# Patient Record
Sex: Female | Born: 1980 | Hispanic: Yes | Marital: Single | Smoking: Never smoker
Health system: Southern US, Community
[De-identification: ages and names within clinical notes are randomized; demographics above are authoritative.]

## PROBLEM LIST (undated history)

## (undated) ENCOUNTER — Inpatient Hospital Stay (HOSPITAL_COMMUNITY): Payer: Self-pay

## (undated) DIAGNOSIS — IMO0002 Reserved for concepts with insufficient information to code with codable children: Secondary | ICD-10-CM

## (undated) DIAGNOSIS — E119 Type 2 diabetes mellitus without complications: Secondary | ICD-10-CM

## (undated) DIAGNOSIS — N751 Abscess of Bartholin's gland: Secondary | ICD-10-CM

## (undated) DIAGNOSIS — Z98891 History of uterine scar from previous surgery: Secondary | ICD-10-CM

## (undated) DIAGNOSIS — E669 Obesity, unspecified: Secondary | ICD-10-CM

## (undated) HISTORY — DX: Obesity, unspecified: E66.9

## (undated) HISTORY — DX: Reserved for concepts with insufficient information to code with codable children: IMO0002

## (undated) HISTORY — DX: History of uterine scar from previous surgery: Z98.891

## (undated) HISTORY — DX: Abscess of Bartholin's gland: N75.1

---

## 2002-07-22 ENCOUNTER — Inpatient Hospital Stay (HOSPITAL_COMMUNITY): Admission: AD | Admit: 2002-07-22 | Discharge: 2002-07-22 | Payer: Self-pay | Admitting: *Deleted

## 2002-07-23 ENCOUNTER — Inpatient Hospital Stay (HOSPITAL_COMMUNITY): Admission: AD | Admit: 2002-07-23 | Discharge: 2002-07-23 | Payer: Self-pay | Admitting: Obstetrics and Gynecology

## 2002-07-25 ENCOUNTER — Inpatient Hospital Stay (HOSPITAL_COMMUNITY): Admission: AD | Admit: 2002-07-25 | Discharge: 2002-07-25 | Payer: Self-pay | Admitting: *Deleted

## 2002-07-26 ENCOUNTER — Inpatient Hospital Stay (HOSPITAL_COMMUNITY): Admission: AD | Admit: 2002-07-26 | Discharge: 2002-07-28 | Payer: Self-pay | Admitting: *Deleted

## 2004-07-18 ENCOUNTER — Emergency Department (HOSPITAL_COMMUNITY): Admission: EM | Admit: 2004-07-18 | Discharge: 2004-07-18 | Payer: Self-pay | Admitting: Emergency Medicine

## 2004-07-21 ENCOUNTER — Ambulatory Visit (HOSPITAL_COMMUNITY): Admission: RE | Admit: 2004-07-21 | Discharge: 2004-07-21 | Payer: Self-pay | Admitting: Emergency Medicine

## 2004-07-31 ENCOUNTER — Emergency Department (HOSPITAL_COMMUNITY): Admission: EM | Admit: 2004-07-31 | Discharge: 2004-07-31 | Payer: Self-pay

## 2005-09-28 ENCOUNTER — Ambulatory Visit (HOSPITAL_COMMUNITY): Admission: RE | Admit: 2005-09-28 | Discharge: 2005-09-28 | Payer: Self-pay | Admitting: *Deleted

## 2005-10-12 ENCOUNTER — Ambulatory Visit: Payer: Self-pay | Admitting: Family Medicine

## 2005-10-13 ENCOUNTER — Ambulatory Visit (HOSPITAL_COMMUNITY): Admission: RE | Admit: 2005-10-13 | Discharge: 2005-10-13 | Payer: Self-pay | Admitting: Obstetrics and Gynecology

## 2005-11-23 ENCOUNTER — Ambulatory Visit: Payer: Self-pay | Admitting: Obstetrics and Gynecology

## 2005-11-29 ENCOUNTER — Ambulatory Visit (HOSPITAL_COMMUNITY): Admission: RE | Admit: 2005-11-29 | Discharge: 2005-11-29 | Payer: Self-pay | Admitting: *Deleted

## 2006-02-26 ENCOUNTER — Inpatient Hospital Stay (HOSPITAL_COMMUNITY): Admission: AD | Admit: 2006-02-26 | Discharge: 2006-02-27 | Payer: Self-pay | Admitting: Family Medicine

## 2006-03-09 ENCOUNTER — Ambulatory Visit: Payer: Self-pay | Admitting: Gynecology

## 2006-03-18 ENCOUNTER — Inpatient Hospital Stay (HOSPITAL_COMMUNITY): Admission: AD | Admit: 2006-03-18 | Discharge: 2006-03-19 | Payer: Self-pay | Admitting: Family Medicine

## 2006-03-21 ENCOUNTER — Ambulatory Visit: Payer: Self-pay | Admitting: Obstetrics and Gynecology

## 2006-04-04 ENCOUNTER — Ambulatory Visit: Payer: Self-pay | Admitting: Family Medicine

## 2006-04-06 ENCOUNTER — Encounter (INDEPENDENT_AMBULATORY_CARE_PROVIDER_SITE_OTHER): Payer: Self-pay | Admitting: Specialist

## 2006-04-06 ENCOUNTER — Ambulatory Visit (HOSPITAL_COMMUNITY): Admission: RE | Admit: 2006-04-06 | Discharge: 2006-04-06 | Payer: Self-pay | Admitting: Family Medicine

## 2006-04-06 ENCOUNTER — Ambulatory Visit: Payer: Self-pay | Admitting: Family Medicine

## 2006-04-19 ENCOUNTER — Ambulatory Visit: Payer: Self-pay | Admitting: Family Medicine

## 2006-06-14 ENCOUNTER — Ambulatory Visit: Payer: Self-pay | Admitting: Obstetrics and Gynecology

## 2006-10-17 ENCOUNTER — Inpatient Hospital Stay (HOSPITAL_COMMUNITY): Admission: AD | Admit: 2006-10-17 | Discharge: 2006-10-17 | Payer: Self-pay | Admitting: Family Medicine

## 2006-10-31 ENCOUNTER — Ambulatory Visit: Payer: Self-pay | Admitting: Obstetrics and Gynecology

## 2007-03-01 ENCOUNTER — Ambulatory Visit: Payer: Self-pay | Admitting: Obstetrics and Gynecology

## 2007-03-06 ENCOUNTER — Ambulatory Visit: Payer: Self-pay | Admitting: Gynecology

## 2007-03-13 ENCOUNTER — Ambulatory Visit: Payer: Self-pay | Admitting: Obstetrics and Gynecology

## 2007-03-15 ENCOUNTER — Ambulatory Visit (HOSPITAL_COMMUNITY): Admission: RE | Admit: 2007-03-15 | Discharge: 2007-03-15 | Payer: Self-pay | Admitting: Family Medicine

## 2007-03-21 ENCOUNTER — Ambulatory Visit: Payer: Self-pay | Admitting: Gynecology

## 2007-05-03 ENCOUNTER — Encounter: Payer: Self-pay | Admitting: Obstetrics & Gynecology

## 2007-05-03 ENCOUNTER — Ambulatory Visit: Payer: Self-pay | Admitting: Obstetrics & Gynecology

## 2007-05-20 ENCOUNTER — Ambulatory Visit (HOSPITAL_COMMUNITY): Admission: RE | Admit: 2007-05-20 | Discharge: 2007-05-20 | Payer: Self-pay | Admitting: Obstetrics and Gynecology

## 2008-01-09 ENCOUNTER — Ambulatory Visit: Payer: Self-pay | Admitting: Obstetrics and Gynecology

## 2008-10-29 ENCOUNTER — Ambulatory Visit: Payer: Self-pay | Admitting: Obstetrics and Gynecology

## 2008-10-29 ENCOUNTER — Encounter (INDEPENDENT_AMBULATORY_CARE_PROVIDER_SITE_OTHER): Payer: Self-pay | Admitting: Obstetrics & Gynecology

## 2008-10-29 LAB — CONVERTED CEMR LAB: Preg, Serum: NEGATIVE

## 2008-10-30 ENCOUNTER — Ambulatory Visit (HOSPITAL_COMMUNITY): Admission: RE | Admit: 2008-10-30 | Discharge: 2008-10-30 | Payer: Self-pay | Admitting: Family Medicine

## 2009-08-30 ENCOUNTER — Inpatient Hospital Stay (HOSPITAL_COMMUNITY): Admission: AD | Admit: 2009-08-30 | Discharge: 2009-08-30 | Payer: Self-pay | Admitting: Obstetrics

## 2009-08-30 ENCOUNTER — Encounter: Admission: RE | Admit: 2009-08-30 | Discharge: 2009-08-30 | Payer: Self-pay | Admitting: Obstetrics

## 2009-09-07 ENCOUNTER — Inpatient Hospital Stay (HOSPITAL_COMMUNITY): Admission: RE | Admit: 2009-09-07 | Discharge: 2009-09-09 | Payer: Self-pay | Admitting: Obstetrics

## 2010-03-15 ENCOUNTER — Inpatient Hospital Stay (HOSPITAL_COMMUNITY): Admission: AD | Admit: 2010-03-15 | Discharge: 2010-03-15 | Payer: Self-pay | Admitting: Obstetrics & Gynecology

## 2011-01-07 ENCOUNTER — Encounter: Payer: Self-pay | Admitting: Family Medicine

## 2011-01-08 ENCOUNTER — Encounter: Payer: Self-pay | Admitting: *Deleted

## 2011-03-12 LAB — CBC
HCT: 39.6 % (ref 36.0–46.0)
Hemoglobin: 13.5 g/dL (ref 12.0–15.0)
MCHC: 34.1 g/dL (ref 30.0–36.0)
MCV: 93 fL (ref 78.0–100.0)
Platelets: 190 10*3/uL (ref 150–400)
RBC: 4.26 MIL/uL (ref 3.87–5.11)
RDW: 12.4 % (ref 11.5–15.5)
WBC: 7.1 10*3/uL (ref 4.0–10.5)

## 2011-03-12 LAB — GC/CHLAMYDIA PROBE AMP, GENITAL
Chlamydia, DNA Probe: NEGATIVE
GC Probe Amp, Genital: NEGATIVE

## 2011-03-12 LAB — WET PREP, GENITAL
Trich, Wet Prep: NONE SEEN
Yeast Wet Prep HPF POC: NONE SEEN

## 2011-03-12 LAB — POCT PREGNANCY, URINE: Preg Test, Ur: NEGATIVE

## 2011-03-24 LAB — CBC
HCT: 26.8 % — ABNORMAL LOW (ref 36.0–46.0)
HCT: 33.8 % — ABNORMAL LOW (ref 36.0–46.0)
Hemoglobin: 11.6 g/dL — ABNORMAL LOW (ref 12.0–15.0)
Hemoglobin: 9.4 g/dL — ABNORMAL LOW (ref 12.0–15.0)
MCHC: 34.2 g/dL (ref 30.0–36.0)
MCHC: 34.9 g/dL (ref 30.0–36.0)
MCV: 92 fL (ref 78.0–100.0)
MCV: 92.1 fL (ref 78.0–100.0)
Platelets: 116 10*3/uL — ABNORMAL LOW (ref 150–400)
Platelets: 142 10*3/uL — ABNORMAL LOW (ref 150–400)
RBC: 2.91 MIL/uL — ABNORMAL LOW (ref 3.87–5.11)
RBC: 3.67 MIL/uL — ABNORMAL LOW (ref 3.87–5.11)
RDW: 13.5 % (ref 11.5–15.5)
RDW: 13.8 % (ref 11.5–15.5)
WBC: 8.3 10*3/uL (ref 4.0–10.5)
WBC: 9.4 10*3/uL (ref 4.0–10.5)

## 2011-03-24 LAB — RPR: RPR Ser Ql: NONREACTIVE

## 2011-04-11 ENCOUNTER — Emergency Department (HOSPITAL_COMMUNITY): Payer: Self-pay

## 2011-04-11 ENCOUNTER — Emergency Department (HOSPITAL_COMMUNITY)
Admission: EM | Admit: 2011-04-11 | Discharge: 2011-04-12 | Disposition: A | Discharge status: Home / Self Care | Payer: Self-pay | Attending: Emergency Medicine | Admitting: Emergency Medicine

## 2011-04-11 DIAGNOSIS — F411 Generalized anxiety disorder: Secondary | ICD-10-CM | POA: Insufficient documentation

## 2011-04-11 DIAGNOSIS — M25469 Effusion, unspecified knee: Secondary | ICD-10-CM | POA: Insufficient documentation

## 2011-04-11 DIAGNOSIS — R51 Headache: Secondary | ICD-10-CM | POA: Insufficient documentation

## 2011-04-11 DIAGNOSIS — F3289 Other specified depressive episodes: Secondary | ICD-10-CM | POA: Insufficient documentation

## 2011-04-11 DIAGNOSIS — R071 Chest pain on breathing: Secondary | ICD-10-CM | POA: Insufficient documentation

## 2011-04-11 DIAGNOSIS — T148XXA Other injury of unspecified body region, initial encounter: Secondary | ICD-10-CM | POA: Insufficient documentation

## 2011-04-11 DIAGNOSIS — M542 Cervicalgia: Secondary | ICD-10-CM | POA: Insufficient documentation

## 2011-04-11 DIAGNOSIS — M25569 Pain in unspecified knee: Secondary | ICD-10-CM | POA: Insufficient documentation

## 2011-04-11 DIAGNOSIS — S8990XA Unspecified injury of unspecified lower leg, initial encounter: Secondary | ICD-10-CM | POA: Insufficient documentation

## 2011-04-11 DIAGNOSIS — Y929 Unspecified place or not applicable: Secondary | ICD-10-CM | POA: Insufficient documentation

## 2011-04-11 DIAGNOSIS — F329 Major depressive disorder, single episode, unspecified: Secondary | ICD-10-CM | POA: Insufficient documentation

## 2011-04-11 LAB — URINALYSIS, ROUTINE W REFLEX MICROSCOPIC
Leukocytes, UA: NEGATIVE
Nitrite: NEGATIVE
Specific Gravity, Urine: 1.026 (ref 1.005–1.030)
Urobilinogen, UA: 1 mg/dL (ref 0.0–1.0)
pH: 6 (ref 5.0–8.0)

## 2011-04-11 LAB — URINE MICROSCOPIC-ADD ON

## 2011-04-11 LAB — POCT PREGNANCY, URINE: Preg Test, Ur: NEGATIVE

## 2011-05-02 NOTE — Group Therapy Note (Signed)
NAME:  Julie [name], INGRAM NO.:  [id number]   MEDICAL RECORD NO.:  [id number]          PATIENT TYPE:  WOC   LOCATION:  [facility]                   FACILITY:  [facility]   PHYSICIAN:  Elsie Lincoln, MD      DATE OF BIRTH:  December 30, 1980   DATE OF SERVICE:                                  CLINIC NOTE   HISTORY OF PRESENT ILLNESS:  The patient is 30 year old female who still  complains of bloating and amenorrhea.  The patient has a 2-week history  of diarrhea and cramping 5-6 times a day.  The patient has had an  ultrasound showing a 3.1-cm cyst that has increased to 4-cm in early  March; she needs a repeat ultrasound to make sure this is not  increasing.  She is also complaining of hair loss and so we need to take  a TSH which would also go along with amenorrhea and hair loss.  We will  also check a CA-125, although epithelial ovarian cancer is extremely  unlikely.  The patient needs to be cycled on Provera again since her  last period was in December.  The patient left before we could give a  prescription.  The patient is having true pelvic pain.   PHYSICAL EXAM:  Temperature 97.5, pulse 72, blood pressure 98/69, weight  208.7, height 5 foot 4-1/2.  GENERAL:  Well-nourished, well-developed no apparent distress.  ABDOMEN:  Obese, nontender and nondistended.  No rebound or guarding,  organomegaly.  GENITALIA:  Tanner 5.  Vagina pink, normal rugae.  Cervix closed,  nontender.  Uterus mobile, tender when I moved the uterus.  Neither  ovary was remarkably large although the left ovary could be felt better  than the right.  RECTUM:  Intact.  EXTREMITIES:  Nontender.   ASSESSMENT/PLAN:  A 30 year old female with amenorrhea and bloating.  1. UCG negative.  2. Repeat ultrasound to follow up ovarian cyst.  3. TSH, CA-125.  4. Increase fiber and stool and ova cultures for diarrhea.  5. Pap smear today.  The patient had received recent GC chlamydia      negative.  6. Return to  clinic early June to follow up results           ______________________________  Elsie Lincoln, MD     KL/MEDQ  D:  05/03/2007  T:  05/03/2007  Job:  [id number]

## 2011-05-02 NOTE — Group Therapy Note (Signed)
NAME:  RETHER, RISON NO.:  [id number]   MEDICAL RECORD NO.:  [id number]          PATIENT TYPE:  WOC   LOCATION:  [facility]                   FACILITY:  [facility]   PHYSICIAN:  Dorthula Perfect, MD     DATE OF BIRTH:  [birth year]   DATE OF SERVICE:  10/29/2008                                  CLINIC NOTE   A 30 year old Hispanic female gravida 2, para 2, abortus 1, last  menstrual period July 14, 2008, presents today with a feeling of being  bloated and a history of irregular periods.  She does not do anything  for birth control.  She apparently has had some irregular periods in the  past but it sounds like this is the longest she has gone without a  period.  June 2008 she had a vaginal ultrasound which revealed a right  ovarian cyst.  The right ovary measured 3.9 x 4.0 x 3.5 and contained a  unilocular simple cyst.  This was slightly smaller than a previous exam.  It was suggested to have a followup.  This however was not done.   She has had bloating in her lower abdomen.  She has not been nauseated.  Her breasts have been slightly tender with no discharge noted.  Bowel  movements occur every 3 days or so.  She only drinks 4 cups of water a  day and no other fluid intake.   PHYSICAL EXAMINATION:  VITAL SIGNS:  Height 5 feet 4-1/2 inches, weight  200 pounds, blood pressure 112/79.  BREASTS:  Her breasts are bilaterally normal.  No masses felt.  She has  no nipple discharge.  ABDOMEN:  Obese, soft and nontender.  No masses are felt.  PELVIC:  External genitalia and BUS glands are normal.  Vagina is  epithelialized as was the cervix.  Bimanual examination, the uterus was  thought to be of normal size but this is a little difficult because of  her body habitus.  Adnexal structures are not palpated because of  abdominal size.   DISPOSITION:  Secondary amenorrhea probably anovulation.   DISPOSITION:  1. Pap smear.  2. Urine pregnancy test was negative.  3.  Qualitative serum hCG.  If this is negative we can then induce      withdrawal bleeding with Provera.  4. Repeat a pelvic ultrasound to re-evaluate the ovarian cyst.  5. Increase fluids, prune or apple juice and perhaps even eating some      prunes.  This should improve the bowel movements and I believe will      help cramping and bloating.           ______________________________  Dorthula Perfect, MD     ER/MEDQ  D:  10/29/2008  T:  10/29/2008  Job:  [id number]

## 2011-05-05 NOTE — Group Therapy Note (Signed)
NAME:  Julie [name], Julie [name] NO.:  [id number]   MEDICAL RECORD NO.:  [id number]          PATIENT TYPE:  WOC   LOCATION:  [facility]                   FACILITY:  [facility]   PHYSICIAN:  Tinnie Gens, MD        DATE OF BIRTH:  1981/09/26   DATE OF SERVICE:                                    CLINIC NOTE   CHIEF COMPLAINT:  Pelvic pain.   HISTORY OF PRESENT ILLNESS:  The patient is a 30 year old gravida 2, para 2  Hispanic female whose last C section was in [place] for abnormal lie,  presumably breech.  She reports that since that time she has been back to  see a doctor in [place] for left-sided pelvic pain, and was told that her  uterus was deviated sharply to the left.  The patient was also told at the  time of C section that she would no longer be able to have any children.  She has not been on any birth control.  She had tried to get an IUD placed  through the health department, but they were unable to do that for her.  The  patient was seen at the health department on September 26, 2005, and was told  that she had a left-sided adnexal mass and a uterus that was deviated to the  left.  The patient was scheduled for an ultrasound followed by followup  here.  The patient continues to have what she terms as severe abdominal  pain.  It is located on the left lower side.  She has no problems with  constipation or dysuria.  No blood in her urine or stools.  The patient has  sharp pain that is markedly intense, and does not seem to get any  improvement from Tylenol whatsoever.  This pain has really been present  since February when she had her C section.   PAST MEDICAL HISTORY:  Allergic rhinitis.   PAST SURGICAL HISTORY:  C section.   MEDICATIONS:  None.   ALLERGIES:  None known.   OBSTETRICAL HISTORY:  She is a G2, P2, 1 vaginal delivery and 1 C section  for abnormal lie.   GYNECOLOGICAL HISTORY:  Menarche at age 43.  Cycles every 2-3 months.  It  lasts 6-7 days.  Medium  amount of bleeding and a lot of pain associated with  it.  No history of abnormal Pap smears.  No history of STDs.   FAMILY HISTORY:  Significant for coronary artery disease and diabetes.   SOCIAL HISTORY:  She does smoke.   REVIEW OF SYSTEMS:  A 14-point review of systems was reviewed.  It is  diffusely positive.  She is having multiple pain, fever, headaches, trouble  hearing, coughing up blood, difficulty breathing, chest pain, hot flashes.   PHYSICAL EXAMINATION:  VITAL SIGNS:  Her temp is 97.9, pulse 72, blood  pressure 112/66, weight 195.7, height 5 feet 3 inches.  GENERAL:  She is a moderately obese Hispanic female in no acute distress.  ABDOMEN:  Soft, nontender, nondistended.  GU:  Normal external female genitalia.  Bartholin, urethral and Skene's  glands are normal.  The vagina is pink  and rugated, and the cervix is  without lesion.  The uterus is very small, anteverted, possibly deviated  mildly to the left but not intensely.  Certainly, there is no evidence of  the uterus being attached to the anterior abdominal wall at all.  The adnexa  were without mass.  The patient is bilaterally tender.  The ultrasound is  reviewed.  It shows a normal uterus, 7.0 x 3.0 x 5.0 cm.  Normal endometrial  stripe of __________  cm.  The prior C section scar is identified in the  lower uterine segment.  Myometrium was homogenous.  Both ovaries were  visualized, and were normal.  There was a dominant follicle in the right  ovary, but no other adnexal masses or free fluid were noted.   IMPRESSION:  1.  Left-sided pelvic pain, unclear etiology.  Certainly no gastrointestinal      or urinary symptoms are noted.  This does seem to be in association with      previous cesarean section.  I suppose the patient may have some scarring      related to that.  There is no clear deviation of the uterus nor are      there are any findings on ultrasound to suggest this is a primary pelvic      problem.  2.   Question of infertility based on the fact that the patient was told she      could no longer have babies.  It is not clear to me that even a deviated      uterus would preclude her having children.  This surgery took place in      [place], and obtaining records would be very difficult.   PLAN:  We will a order CT with and without contrast for this patient to try  to assess her bowel and urinary function, and see if this is possibly some  sort of etiology of her pain including some possible scarring.  Intermittent  bowel obstruction may explain some of her pain.  She will follow up in 4-6  weeks here for discussion of results of this test.           ______________________________  Tinnie Gens, MD     TP/MEDQ  D:  10/12/2005  T:  10/12/2005  Job:  [id number]

## 2011-05-05 NOTE — Group Therapy Note (Signed)
NAME:  Julie [name], BURESH NO.:  [id number]   MEDICAL RECORD NO.:  [id number]          PATIENT TYPE:  WOC   LOCATION:  [facility]                   [facility]ACILITY[facility]   PHYSICIAN:  Argentina Donovan, MD        DATE OF BIRTH:  12/04/1981   DATE OF SERVICE:                                  CLINIC NOTE   A 30 year old Hispanic female gravida 2, para 2-0-0-2 that comes in  because of vulvar anal itching and vaginal fishy discharge, has used 3  days ago Monistat and had a history of some occasional rectal bleeding,  also has not had a period since January. Has had several urine pregnancy  tests that have been negative and had a recent Pap smear at the health  department.   External examination, the perineum is moist.  There is a small rectal  tag. Rectal exam reveals no sign of hemorrhoids or no rectal mass.  Introitus is marital,  no sign of inflammation but a heavy creamy  discharge.  Vaginal exam revealed heavy amount of whitish thick  discharge probably secondary to the Monistat although I cannot rule out  heavy yeast, but I think that is highly unlikely. GC and Chlamydia probe  as well as a wet prep was taken, however I am not sure with the heavy  discharge how reliable any of these may be.   We are going to run a quantitive beta on her, if it is negative we will  do Provera for 5 days to bring on a period. Also because of the  anecdotal fishy smell that she complained of, start her on Flagyl 500  b.i.d. for 7 days, Mycolog-2 cream for the perineum. She is also been  instructed to keep that area dry and Anusol hydrocortisone suppositories  and if she has continued rectal bleeding to consult a surgeon.   IMPRESSION:  Vulvar itching with fishy discharge, presumable bacterial  vaginosis and rectal itching with presumable internal hemorrhoids, and  amenorrhea.           ______________________________  Argentina Donovan, MD     PR/MEDQ  D:  03/01/2007  T:  03/02/2007  Job:   [id number]

## 2011-05-05 NOTE — Group Therapy Note (Signed)
NAME:  Julie [name], Julie [name] NO.:  [id number]   MEDICAL RECORD NO.:  [id number]          PATIENT TYPE:  WOC   LOCATION:  [facility]                   [facility]ACILITY:  [facility]   PHYSICIAN:  Ginger Carne, MD DATE OF BIRTH:  Apr 16, 1981   DATE OF SERVICE:                                    CLINIC NOTE   REASON FOR CONSULTATION:  Followup for spontaneous abortion.   HISTORY OF PRESENT ILLNESS:  This is a 30 year old Hispanic female diagnosed  with a positive pregnancy test and quantitative hCG of 11,673 on February 26, 2006, having been seen in the maternity admission unit.  At that time,  ultrasound was consistent with an incomplete abortion with a collapsed sac  and a simple right ovarian cyst.  The patient had a prior history of  amenorrhea.  Prior to that time, she has had a cesarean section in [place] 6  months ago with persistent pelvic pain.   On today's visit, she report a continued bleeding, for which she had bled  approximately 20 days prior to her visit in the MAU on February 26, 2006.  The  patient states that goes through 7 or 8 pads daily and this varies.  She  otherwise complains of modest left lower quadrant pain.   PERTINENT PHYSICAL FINDINGS:  PELVIC:  Uterus is about 4-6 weeks in size and  globular.  Both adnexa palpable, minimal tenderness on the left side, but  otherwise no masses.  There is scant bleeding at this visit.   IMPRESSION:  Spontaneous abortion, first trimester.   PLAN:  The patient will have a repeat quantitative hCG today, as well as a  CBC to exclude anemia.  It was explained to the patient through Eda, the  interpreter, that it is not unusual to bleed 6-8 weeks following a  spontaneous miscarriage.  As long as her quantitative hCGs drop about 60% in  7 days and do not plateau, her bleeding should slow down or stop in the next  2-3 weeks.  Pending any abnormalities on her laboratory work today, she will  be seen back in 2 weeks.  The patient  understands the treatment, management  and all questions answered to the satisfaction of said patient.           ______________________________  Ginger Carne, MD     SHB/MEDQ  D:  03/09/2006  T:  03/10/2006  Job:  [id number]

## 2011-05-05 NOTE — Group Therapy Note (Signed)
NAME:  TARICA, HARL NO.:  [id number]   MEDICAL RECORD NO.:  [id number]          PATIENT TYPE:  WOC   [facility]ATION[facility]                   [facility]ILITY[facility]   PHYSICIAN:  Argentina Donovan, MD        DATE OF BIRTH:  1981/06/27   DATE OF SERVICE:                                    CLINIC NOTE   The patient is a 30 year old, gravida 2, para 2-0-0-2, who went into the  maternity admissions office at end of March, had a spontaneous abortion  witha quantitative of 1829.  She continued to bleed and they repeated that  the day prior to coming to this clinic.  It was down to 279.  The bleeding  has abated significantly and we have talked to the patient about her plans  for the future.  I have told her she will do a D&C if she has not stopped  bleeding completely in two weeks, and we discussed options for birth  control.  She cannot take the pill because this makes the patient dizzy.  She cannot use the IUD because of severe cramps and does not know what else  to do.  We have talked to her about NovaRing and she is going to try that.  We are going to give her two samples and give her a prescription up until  she needs a Pap smear in January.           ______________________________  Argentina Donovan, MD     PR/MEDQ  D:  03/21/2006  T:  03/22/2006  Job:  [id number]

## 2011-05-05 NOTE — Group Therapy Note (Signed)
NAME:  Julie [name], JUAREZ NO.:  [id number]   MEDICAL RECORD NO.:  [id number]          PATIENT TYPE:  WOC   LOCATION[facility]                   [facility]ILITY[facility]   PHYSICIAN:  Dorthula Perfect, MD     DATE OF BIRTH:  [birth year]   DATE OF SERVICE:                                    CLINIC NOTE   This 30 year old Hispanic female gravida 2, para 2 had an IUD inserted back  in June of this year.  Since that time she has had symptomatic irregular  bleeding as well as pelvic cramps and pain.  She has been seen in the MAU  once or twice because of the pain and the most recent time was told she had  a possible infected IUD.  She was placed on antibiotics.  In the past, she  has been on birth control pills but these apparently made her dizzy.  She  is desirous of having 1 more child.   PHYSICAL EXAMINATION:  VITAL SIGNS:  Height 5 feet 4 inches, weight 208,  blood pressure 129/79.  ABDOMEN:  Somewhat obese, soft, nontender.  No masses felt.  PELVIC:  External genitalia and BUS glands  are normal.  Vaginal vault was  negative.  The cervix was located high in the vagina and a very short string  is noted.  Bimanual examination:  The uterus is normal size and shape.  Adnexa structures including ovaries were normal.   IMPRESSION:  Menometrorrhagia and pelvic pain secondary to intrauterine  device.   DISPOSITION:  1. The IUD is removed.  2. NuvaRing to use 3 weeks out of 4 weeks plus condoms.  If, over the next      3-6 months or so the NuvaRing is a problem to use then we can entertain      the idea of using patch.           ______________________________  Dorthula Perfect, MD     ER/MEDQ  D:  10/31/2006  T:  10/31/2006  Job:  [id number]

## 2011-05-05 NOTE — Group Therapy Note (Signed)
NAME:  Julie [name], Julie [name] NO.:  [id number]   MEDICAL RECORD NO.:  [id number]          PATIENT TYPE:  WOC   [facility]ATION[facility]                   [facility]ACILITY:  [facility]   PHYSICIAN:  Argentina Donovan, MD        DATE OF BIRTH:  03-11-1981   DATE OF SERVICE:                                  CLINIC NOTE   CHIEF COMPLAINT:  Followup pruritus and new lower abdominal pain.   HISTORY OF PRESENT ILLNESS:  A 30 year old Hispanic female G 2, P 2-0-0-  2 here for:  1. Followup of vaginal itching.  The patient was prescribed high dose      topical 0.5% Cloderm steroid cream t.i.d. x1 week at a previous      visit.  This cleared up her itching quite well and she is only      using this about every third day.  She has had a little bit of      vaginal dryness in that area since that time.  2. The patient has a new abdominal pain to the bilateral lower      quadrants.  This is worse at night.  Certain movements make it      worse.  It is 10/10 at night.  It is currently 7/10, though the      patient did not appear to be in any obvious pain.  It does not      radiate anywhere.  Not associated with any dysuria, et Karie Soda.  Her      last menstrual period was back in December.  She has had a recent      negative urine pregnancy test, as well as quantitative beta hCG      about 2 weeks ago that was undetectable.  She denies any nausea,      vomiting, diarrhea, or constipation.   OBJECTIVE:  Temperature 98.6, pulse 81, blood pressure 107/72.  Weight  211.8, height 5 feet 4-1/2 inches.  GENERAL:  In no acute distress Hispanic female.  ABDOMEN:  Soft.  Normoactive bowel sounds.  Only tender to very deep  palpation to the right inguinal area.  Otherwise, nontender to  palpation.  No CVA tenderness.  LUNGS:  Clear to auscultation.  CARDIOVASCULAR:  Regular rate and rhythm.   ASSESSMENT AND PLAN:  A 30 year old Hispanic female with:  1. Abnormal uterine bleeding.  Will attempt a withdrawal bleed  with      Provera 5 mg b.i.d. x5 days.  She is interested in having another      child, so if this does not regularize her periods, she should      return in the next 2 to 3 months for a possible workup of      infertility, et Karie Soda.  2. Lower abdominal pain.  We had set the patient up for a pelvic      ultrasound.  Not sure of the cause of this at this time.  Perhaps      it is an ovarian cyst.  3. Vaginal/anal itching.  The patient will be switched to just      hydrocortisone as needed for this issue, since it  mostly resolved      and we do not want to have to use a potent topical steroid long      term.           ______________________________  Argentina Donovan, MD     PR/MEDQ  D:  03/13/2007  T:  03/13/2007  Job:  [id number]

## 2011-05-05 NOTE — Group Therapy Note (Signed)
NAME:  Julie [name], Julie [name] NO.:  [id number]   MEDICAL RECORD NO.:  [id number]          PATIENT TYPE:  WOC   LOCATION[facility]                   FACILITY:  [facility]   PHYSICIAN:  Argentina Donovan, MD        DATE OF BIRTH:  23-Oct-1981   DATE OF SERVICE:                                    CLINIC NOTE   This is a followup visit for this patient for ultrasound results.  She is a  30 year old, gravida 2, para 2-0-0-2 who had her last C-section in [place] 6  months ago and since that time has had pelvic pain.  The ultrasound was  normal an exception was a small 2 and 1/2 cm right ovarian cyst for which  they wanted follow-up and that will be ordered today.  In addition she has  been amenorrheic for the past 2 months, has gained quite a bit if weight  since her delivery and has a family history of diabetes.  We will get a  hemoglobin A1C as well as a qualitative beta HCG to rule out pregnancy and  then if she is not pregnant I would wait at least 6 months to do any kind of  workup for amenorrhea.  I have told here that if the ultrasound is normal  and the pain persist she may very well need a laparoscopic examination as  scar tissue will not show up on ultrasound.   DIAGNOSIS:  Amenorrhea secondary to pelvic pain.           ______________________________  Argentina Donovan, MD     PR/MEDQ  D:  11/23/2005  T:  11/24/2005  Job:  [id number]

## 2011-05-05 NOTE — Op Note (Signed)
NAME:  Julie [name], Julie [name]       ACCOUNT NO.:  [id number]   MEDICAL RECORD NO.:  [id number]          PATIENT TYPE:  AMB   LOCATION:  [facility]                           [facility]   PHYSICIAN:  Tanya S. Shawnie Pons, M.D.   DATE OF BIRTH:  1981/01/03   DATE OF PROCEDURE:  04/06/2006  DATE OF DISCHARGE:                                 OPERATIVE REPORT   PREOPERATIVE DIAGNOSIS:  Bleeding after medical abortion.   POSTOPERATIVE DIAGNOSIS:  Bleeding after medical abortion.   OPERATION PERFORMED:  Suction dilation and curettage.   SURGEON:  Shelbie Proctor. Shawnie Pons, M.D.   ASSISTANT:  None.   ANESTHESIA:  MAC and local.   SPECIMENS:  Uterine contents.   ESTIMATED BLOOD LOSS:  Minimal.   COMPLICATIONS:  None.   INDICATIONS FOR PROCEDURE:  The patient is a 30 year old gravida 3, para 2-0-  1-2, who underwent an spontaneous abortion on April 2, who got Cytotec at  that time.  She had a decreasing beta, had followed up in the office  approximately one week prior to the procedure, was still bleeding and having  problems with D&E as she was still bleeding at her next visit.  She had  continued bleeding at that point which was approximately three weeks after  spontaneous abortion and she wanted a dilation and evacuation.   DESCRIPTION OF PROCEDURE:  The patient was taken to the operating room.  She  was placed in dorsal lithotomy position in Crownpoint stirrups, was prepped and  draped in the usual sterile fashion.  When MAC anesthesia was felt to be  adequate a red rubber catheter was used to drain her bladder.  The speculum  was placed inside the vagina.  The cervix was visualized and grasped  anteriorly with a single toothed tenaculum and 10 mL of 0.25% lidocaine with  epinephrine were injected in a paracervical block.  The cervix was then  sequentially dilated __________ up to 8 cm and the 8 French curved suction  curette was then placed inside the uterine cavity.  Three passes were made  with very  little tissue being removed.  Sharp curettage was then done, until  there was a gritty layer  in all four quadrants and the suction curette was passed again.  The patient  tolerated the procedure well.  All instruments were removed from the vagina.  The patient was taken to the recovery room in stable condition.  All  instrument, needle and lap counts were correct x2.           ______________________________  Shelbie Proctor. Shawnie Pons, M.D.     TSP/MEDQ  D:  04/06/2006  T:  04/07/2006  Job:  [id number]

## 2011-12-20 ENCOUNTER — Encounter: Payer: Self-pay | Admitting: *Deleted

## 2012-01-22 ENCOUNTER — Encounter: Payer: Self-pay | Admitting: Physician Assistant

## 2012-01-22 ENCOUNTER — Ambulatory Visit (INDEPENDENT_AMBULATORY_CARE_PROVIDER_SITE_OTHER): Payer: Self-pay | Admitting: Physician Assistant

## 2012-01-22 VITALS — BP 121/77 | HR 78 | Temp 97.6°F | Ht 64.0 in | Wt 226.5 lb

## 2012-01-22 DIAGNOSIS — N911 Secondary amenorrhea: Secondary | ICD-10-CM | POA: Insufficient documentation

## 2012-01-22 DIAGNOSIS — E669 Obesity, unspecified: Secondary | ICD-10-CM

## 2012-01-22 DIAGNOSIS — N912 Amenorrhea, unspecified: Secondary | ICD-10-CM

## 2012-01-22 HISTORY — DX: Obesity, unspecified: E66.9

## 2012-01-22 LAB — CBC
HCT: 41.3 % (ref 36.0–46.0)
Hemoglobin: 14.1 g/dL (ref 12.0–15.0)
MCH: 31.3 pg (ref 26.0–34.0)
MCV: 91.6 fL (ref 78.0–100.0)
Platelets: 186 10*3/uL (ref 150–400)
RBC: 4.51 MIL/uL (ref 3.87–5.11)
WBC: 7.4 10*3/uL (ref 4.0–10.5)

## 2012-01-22 LAB — BASIC METABOLIC PANEL
CO2: 27 mEq/L (ref 19–32)
Chloride: 102 mEq/L (ref 96–112)
Creat: 0.67 mg/dL (ref 0.50–1.10)
Glucose, Bld: 66 mg/dL — ABNORMAL LOW (ref 70–99)

## 2012-01-22 LAB — TSH: TSH: 1.247 u[IU]/mL (ref 0.350–4.500)

## 2012-01-22 MED ORDER — MEDROXYPROGESTERONE ACETATE 10 MG PO TABS: 10.0000 mg | ORAL_TABLET | Freq: Every day | ORAL | Status: DC
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
## 2012-01-22 NOTE — Progress Notes (Signed)
Chief Complaint:  Amenorrhea   Julie [name] is  31 y.o. [phone].  Patient's last menstrual period was 02/22/2011.Marland Kitchen  Her pregnancy status is negative.  She presents complaining of Amenorrhea  Onset is described as ongoing and has been present for  1 years.   Obstetrical/Gynecological History: Pertinent Gynecological History: Menses: absent x 12 months Contraception: none DES exposure: unknown Blood transfusions: none Sexually transmitted diseases: no past history Previous GYN Procedures: none  Last mammogram: n/a  Last pap: normal Date: 06/2011   Past Medical History: History reviewed. No pertinent past medical history.  Past Surgical History: Past Surgical History  Procedure Date  . Cesarean section     x 2    Family History: History reviewed. No pertinent family history.  Social History: History  Substance Use Topics  . Smoking status: Never Smoker   . Smokeless tobacco: Never Used  . Alcohol Use: No    Allergies: No Known Allergies   Review of Systems - History obtained from the patient Endocrine ROS: positive for - hair pattern changes, skin changes and unexpected weight changes negative for - palpitations or polydipsia/polyuria Respiratory ROS: no cough, shortness of breath, or wheezing Cardiovascular ROS: no chest pain or dyspnea on exertion Gastrointestinal ROS: no abdominal pain, change in bowel habits, or black or bloody stools Genito-Urinary ROS: no dysuria, trouble voiding, or hematuria positive for - change in menstrual cycle  Physical Exam   Blood pressure 121/77, pulse 78, temperature 97.6 F (36.4 C), temperature source Oral, height 5\' 4"  (1.626 m), weight 226 lb 8 oz (102.74 kg), last menstrual period 02/22/2011.  General: General appearance - alert, well appearing, and in no distress, oriented to person, place, and time and overweight Mental status - alert, oriented to person, place, and time, normal mood, behavior, speech, dress, motor  activity, and thought processes, affect appropriate to mood Neck - supple, no significant adenopathy, thyroid exam: no nodules felt, skin, hair, nails normal, no edema Abdomen - soft, nontender, nondistended, no masses or organomegaly Obese Pelvic - normal external genitalia, vulva, vagina, cervix, uterus and adnexa Skin - normal coloration and turgor, no rashes, no suspicious skin lesions noted HAIR: hair exam is normal without alopecia or scalp lesions Focused Gynecological Exam: VULVA: normal appearing vulva with no masses, tenderness or lesions, VAGINA: normal appearing vagina with normal color and discharge, no lesions, CERVIX: normal appearing cervix without discharge or lesions, UTERUS: uterus is normal size, shape, consistency and nontender, ADNEXA: normal adnexa in size, nontender and no masses  Labs: Recent Results (from the past 24 hour(s))  POCT PREGNANCY, URINE   Collection Time   01/22/12  4:11 PM      Component Value Range   Preg Test, Ur NEGATIVE  NEGATIVE     Assessment: Amenorrhea Obesity  Plan: 1. Will check labs and start provera challenge 2. Counseled towards weight loss 3. Referral to [facility] for PCP needs.  Julie-Anne Torain E. 01/22/2012,4:28 PM

## 2012-01-22 NOTE — Patient Instructions (Addendum)
Secondary Amenorrhea  Secondary amenorrhea is the stopping of menstrual flow for 3 to 6 months in a female who has previously had periods. There are many possible causes. Most of these causes are not serious. Usually treating the underlying problem causing the loss of menses will return your periods to normal. CAUSES  Some common and uncommon causes of not menstruating include:  Malnutrition.   Low blood sugar (hypoglycemia).   Polycystic ovarian disease.   Stress or fear.   Breastfeeding.   Hormone imbalance.   Ovarian failure.   Medications.   Extreme obesity.   Cystic fibrosis.   Low body weight or drastic weight reduction from any cause.   Early menopause.   Removal of ovaries or uterus.   Contraceptives.   Illness.   Long term (chronic) illnesses.   Cushing's syndrome.   Thyroid problems.   Birth control pills, patches, or vaginal rings for birth control.  DIAGNOSIS  This diagnosis is made by your caregiver taking a medical history and doing a physical exam. Pregnancy must be ruled out. Often times, numerous blood tests of different hormones in the body may be measured. Urine testing may be done. Specialized x-rays may have to be done as well as measuring the body mass index (BMI). TREATMENT  Treatment depends on the cause of the amenorrhea. If an eating disorder is present, this can be treated with an adequate diet and therapy. Chronic illnesses may improve with treatment of the illness. Overall, the outlook is good. The amenorrhea may be corrected with medications, lifestyle changes, or surgery. If the amenorrhea cannot be corrected, it is sometimes possible to create a false menstruation with medications. Document Released: 01/15/2007 Document Revised: 08/16/2011 Document Reviewed: 11/22/2007 [facility] Patient Information 2012 [place], [place].Managed Care Referral Many emergency patients get their medical insurance through an HMO or managed care plan. Your  illness or injury may not be serious or need treatment right away. If this is true, you may be asked to go to your primary caregiver's clinic to be treated. A medical screening exam should be done to be sure it is safe for you to travel to your caregiver's office. Your condition does not seem to be unstable. Your caregiver's office has the needed people and equipment to treat your problem. Call your caregiver if you have questions about your medical problem or your insurance coverage. Document Released: 01/11/2005 Document Revised: 08/16/2011 Document Reviewed: 12/04/2005 [facility] Patient Information 2012 [place], [place].Derivacin Sherron Flemings Atencin  [facility] Referral) Muchos pacientes reciben su seguro mdico a travs del HMO, u organizacin para el mantenimiento de [employer]. Su enfermedad o lesin puede no ser grave ni necesitar tratamiento enseguida. Si este es [employer], se le pedir que concurra a la clnica de su mdico de cabecera para Medical illustrator. Se le podrn realizar una serie de exmenes para verificar si puede viajar con seguridad hasta el consultorio de su mdico. Su enfermedad no parece ser inestable. El consultorio de su mdico posee el personal y el equipo necesario para tratar su problema. Comunquese con su mdico si tiene preguntas acerca de su problema mdico o de su cobertura mdica. Document Released: 12/04/2005 Document Revised: 08/16/2011 [facility] Patient Information 2012 [place], [place].

## 2012-01-25 ENCOUNTER — Encounter: Payer: Self-pay | Admitting: *Deleted

## 2012-01-25 DIAGNOSIS — Z01419 Encounter for gynecological examination (general) (routine) without abnormal findings: Secondary | ICD-10-CM

## 2012-02-12 ENCOUNTER — Encounter: Payer: Self-pay | Admitting: Advanced Practice Midwife

## 2012-02-12 ENCOUNTER — Ambulatory Visit (INDEPENDENT_AMBULATORY_CARE_PROVIDER_SITE_OTHER): Payer: Self-pay | Admitting: Advanced Practice Midwife

## 2012-02-12 DIAGNOSIS — Z0000 Encounter for general adult medical examination without abnormal findings: Secondary | ICD-10-CM

## 2012-02-12 DIAGNOSIS — N912 Amenorrhea, unspecified: Secondary | ICD-10-CM

## 2012-02-12 DIAGNOSIS — Z13220 Encounter for screening for lipoid disorders: Secondary | ICD-10-CM

## 2012-02-12 DIAGNOSIS — Z01419 Encounter for gynecological examination (general) (routine) without abnormal findings: Secondary | ICD-10-CM

## 2012-02-12 MED ORDER — MEDROXYPROGESTERONE ACETATE 10 MG PO TABS: 10.0000 mg | ORAL_TABLET | Freq: Every day | ORAL | Status: DC

## 2012-02-12 NOTE — Patient Instructions (Signed)
Secondary Amenorrhea  Secondary amenorrhea is the stopping of menstrual flow for 3 to 6 months in a female who has previously had periods. There are many possible causes. Most of these causes are not serious. Usually treating the underlying problem causing the loss of menses will return your periods to normal. CAUSES  Some common and uncommon causes of not menstruating include:  Malnutrition.   Low blood sugar (hypoglycemia).   Polycystic ovarian disease.   Stress or fear.   Breastfeeding.   Hormone imbalance.   Ovarian failure.   Medications.   Extreme obesity.   Cystic fibrosis.   Low body weight or drastic weight reduction from any cause.   Early menopause.   Removal of ovaries or uterus.   Contraceptives.   Illness.   Long term (chronic) illnesses.   Cushing's syndrome.   Thyroid problems.   Birth control pills, patches, or vaginal rings for birth control.  DIAGNOSIS  This diagnosis is made by your caregiver taking a medical history and doing a physical exam. Pregnancy must be ruled out. Often times, numerous blood tests of different hormones in the body may be measured. Urine testing may be done. Specialized x-rays may have to be done as well as measuring the body mass index (BMI). TREATMENT  Treatment depends on the cause of the amenorrhea. If an eating disorder is present, this can be treated with an adequate diet and therapy. Chronic illnesses may improve with treatment of the illness. Overall, the outlook is good. The amenorrhea may be corrected with medications, lifestyle changes, or surgery. If the amenorrhea cannot be corrected, it is sometimes possible to create a false menstruation with medications. Document Released: 01/15/2007 Document Revised: 08/16/2011 Document Reviewed: 11/22/2007 [employer] Patient Information 2012 [employer]. 

## 2012-02-12 NOTE — Progress Notes (Signed)
  Subjective:    Patient ID: Julie [name], female    DOB: July 20, 1981, 31 y.o.   MRN: [id number]  HPI Pt seen 3 weeks ago for secondary amenorrhea since 3/12. Here for results. Did not take Provera challenge due to misunderstanding. She is also concerned about heavy bleeding because she states she has a Hx of heavy bleeding w/ OCPs and Mirena. Desires future pregnancies. Awaiting orange card (applied today). Requesting referral to PCP for preventive care and cholesterol testing.   Review of Systems: Small amount of leaking from breast this morning, none prior.     Objective:   Physical Exam: deferred Recent Results (from the past 672 hour(s))  POCT PREGNANCY, URINE   Collection Time   01/22/12  4:11 PM      Component Value Range   Preg Test, Ur NEGATIVE  NEGATIVE   TSH   Collection Time   01/22/12  4:28 PM      Component Value Range   TSH 1.247  0.350 - 4.500 (uIU/mL)  LUTEINIZING HORMONE   Collection Time   01/22/12  4:28 PM      Component Value Range   LH 10.5    FOLLICLE STIMULATING HORMONE   Collection Time   01/22/12  4:28 PM      Component Value Range   FSH 5.3    CBC   Collection Time   01/22/12  4:28 PM      Component Value Range   WBC 7.4  4.0 - 10.5 (K/uL)   RBC 4.51  3.87 - 5.11 (MIL/uL)   Hemoglobin 14.1  12.0 - 15.0 (g/dL)   HCT 08.6  57.8 - 46.9 (%)   MCV 91.6  78.0 - 100.0 (fL)   MCH 31.3  26.0 - 34.0 (pg)   MCHC 34.1  30.0 - 36.0 (g/dL)   RDW 62.9  52.8 - 41.3 (%)   Platelets 186  150 - 400 (K/uL)  BASIC METABOLIC PANEL   Collection Time   01/22/12  4:28 PM      Component Value Range   Sodium 138  135 - 145 (mEq/L)   Potassium 4.0  3.5 - 5.3 (mEq/L)   Chloride 102  96 - 112 (mEq/L)   CO2 27  19 - 32 (mEq/L)   Glucose, Bld 66 (*) 70 - 99 (mg/dL)   BUN 11  6 - 23 (mg/dL)   Creat 2.44  0.10 - 2.72 (mg/dL)   Calcium 9.3  8.4 - 53.6 (mg/dL)       Assessment & Plan:  Assessment: 1. Secondary amenorrhea of unknown etiology  Plan: 1. Pt encouraged to do  Provera challenge to both help identify cause of amenorrhea and thin endometrium. Discussed increased risk of endometrial cancer in women w/ endometrial hyperplasia. 2. Will scheduled pelvic US to eval for hyperplasia when pt is financially able. 3. Recommend F/U in 4-6 weeks w/ MD. 4. Referred to [facility] 5. Bleeding precautions  Dorathy Kinsman 02/12/2012

## 2012-02-27 ENCOUNTER — Ambulatory Visit (INDEPENDENT_AMBULATORY_CARE_PROVIDER_SITE_OTHER): Payer: Self-pay | Admitting: Family Medicine

## 2012-02-27 ENCOUNTER — Ambulatory Visit: Payer: Self-pay | Admitting: Family Medicine

## 2012-02-27 ENCOUNTER — Encounter: Payer: Self-pay | Admitting: Family Medicine

## 2012-02-27 VITALS — BP 130/84 | HR 77 | Temp 98.1°F | Ht 67.0 in | Wt 226.0 lb

## 2012-02-27 DIAGNOSIS — N912 Amenorrhea, unspecified: Secondary | ICD-10-CM

## 2012-02-27 DIAGNOSIS — E669 Obesity, unspecified: Secondary | ICD-10-CM

## 2012-02-27 DIAGNOSIS — D1779 Benign lipomatous neoplasm of other sites: Secondary | ICD-10-CM

## 2012-02-27 DIAGNOSIS — D1720 Benign lipomatous neoplasm of skin and subcutaneous tissue of unspecified limb: Secondary | ICD-10-CM

## 2012-02-27 NOTE — Assessment & Plan Note (Signed)
Patient to follow up with OB/GYN as scheduled.

## 2012-02-27 NOTE — Assessment & Plan Note (Signed)
At risk for DM and HLD. WIll get lipids and a1c at next visit once has orange card. Will also provide more extensive counseling about effects of obesity.

## 2012-02-27 NOTE — Progress Notes (Signed)
  Subjective:    Patient ID: Julie [name], female    DOB: [birth year], 31 y.o.   MRN: [id number]  HPI Patient with history of obesity and amenorrhea presents to establish PCP Interpreter [employer] Jean Rosenthal present for visit and interpreted.   Patient was referred by OB/GYN who is evaluating amenorrhea for PCP and to test Lipids as well as other health maintenance. Patient DOES NOT YET HAVE ORANGE CARD. Due to tests possibly not being covered, deferred testing and vaccines to a later date.   1. Bumps on leg-had a bump on her leg come up 10 years ago on outter thigh. Since that time, she will occasionally have pain in it if she is really active. SHe says it feels like an "explosion" when it hurts and then radiates pain down her leg. It can sometimes tingle at night. Wants to make sure this is not cancer. Also had a smaller bump under her skin slightly lower than 1st bump come up within last year which is not painful.   2. Amenorrhea/sexual activity-patient to continue to follow with OB/GYN. Has not been sexually active in last year. REports only history of STD 10 years ago when she had chlamydia. SHe would use condoms for protection if she were to be sexually active as she says she had a poor experience with an IUD>   3. Obesity/HLD-planned to test lipids today and patient fasting but will due at future visit since does not have orange card yet. Patient willing to discuss lifestyle changes at future visit.   4. Health Maintenance- has had pap in 3 years. Declines flu. Has never had TDAP.   Review of Systems negative except as noted in HPI   Objective:   Physical Exam  Constitutional: She is oriented to person, place, and time. She appears well-developed and well-nourished. No distress.       Obese   HENT:  Head: Normocephalic and atraumatic.  Right Ear: Tympanic membrane and ear canal normal.  Left Ear: Tympanic membrane and ear canal normal.  Mouth/Throat: Oropharynx is clear and moist. No  oropharyngeal exudate.  Eyes: Conjunctivae and EOM are normal. Pupils are equal, round, and reactive to light.  Neck: Normal range of motion. Neck supple. No thyromegaly present.  Cardiovascular: Normal rate and regular rhythm.  Exam reveals no gallop and no friction rub.   No murmur heard. Pulmonary/Chest: Effort normal and breath sounds normal. No respiratory distress. She has no wheezes. She has no rales.  Abdominal: Soft. Bowel sounds are normal. She exhibits no distension. There is no tenderness. There is no rebound.  Musculoskeletal: Normal range of motion. She exhibits no edema.       Mobile 3 cm x2 cm subcutaneous nodule noted on LLE on thigh. SOmehwhat tender to palpation. Smaller 0.5cm by 0.5 cm nodule noted 5 inches lower.   Lymphadenopathy:    She has no cervical adenopathy.  Neurological: She is alert and oriented to person, place, and time.          Assessment & Plan:

## 2012-02-27 NOTE — Patient Instructions (Signed)
It was nice to meet you today. I am glad that I will get to be your regular doctor. I would like for you to continue to work on getting the orange card with [employer]. Once you have that card, I would like to see you again so we can do some lab tests. The spot on your leg is normal and it is called a lipoma. You can try some ice for about 15 minutes each night to help with tingling. You can try Tylenol when you have some pain. Hopefully we can see each other within the next month for these tests.   Sincerely,  Dr. 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 A lipoma is a benign, non-cancerous, tumor that contains fat cells. Lipomas tend to occur right under the skin in certain areas, especially the back, shoulders, buttocks, and thighs. They are the most common soft tissue tumor. Lipomas can usually be felt as soft, non-tender masses. They seldom cause problems and no treatment is needed. Lipomas can be removed surgically if there is any question about the diagnosis, or if they cause any cosmetic problem. See your doctor for further evaluation as recommended. SEEK MEDICAL CARE IF:    The lipoma becomes larger or hard.   The lipoma becomes painful, red, or increasingly swollen. These could be signs of infection or a more serious condition.

## 2012-02-27 NOTE — Assessment & Plan Note (Signed)
Instructed patient to follow up if mass growing in size. INformed patient that it is mobile and not deep in muscle. If continues to bother patient after she uses ice on it daily before sleep, could consider excision. 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

## 2012-02-28 ENCOUNTER — Other Ambulatory Visit: Payer: Self-pay | Admitting: *Deleted

## 2012-02-28 DIAGNOSIS — N912 Amenorrhea, unspecified: Secondary | ICD-10-CM

## 2012-02-28 MED ORDER — MEDROXYPROGESTERONE ACETATE 10 MG PO TABS: 10.0000 mg | ORAL_TABLET | Freq: Every day | ORAL | Status: DC
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
## 2012-03-06 ENCOUNTER — Inpatient Hospital Stay (HOSPITAL_COMMUNITY)
Admission: AD | Admit: 2012-03-06 | Discharge: 2012-03-06 | Disposition: A | Discharge status: Home / Self Care | Payer: Self-pay | Source: Ambulatory Visit | Attending: Obstetrics and Gynecology | Admitting: Obstetrics and Gynecology

## 2012-03-06 ENCOUNTER — Encounter (HOSPITAL_COMMUNITY): Payer: Self-pay

## 2012-03-06 DIAGNOSIS — N390 Urinary tract infection, site not specified: Secondary | ICD-10-CM

## 2012-03-06 DIAGNOSIS — R300 Dysuria: Secondary | ICD-10-CM | POA: Insufficient documentation

## 2012-03-06 LAB — URINALYSIS, ROUTINE W REFLEX MICROSCOPIC
Bilirubin Urine: NEGATIVE
Ketones, ur: NEGATIVE mg/dL
Specific Gravity, Urine: 1.025 (ref 1.005–1.030)
Urobilinogen, UA: 0.2 mg/dL (ref 0.0–1.0)

## 2012-03-06 LAB — URINE MICROSCOPIC-ADD ON

## 2012-03-06 LAB — WET PREP, GENITAL
Clue Cells Wet Prep HPF POC: NONE SEEN
Yeast Wet Prep HPF POC: NONE SEEN

## 2012-03-06 MED ORDER — KETOROLAC TROMETHAMINE 60 MG/2ML IM SOLN
60.0000 mg | Freq: Once | INTRAMUSCULAR | Status: AC
  Administered 2012-03-06: 60 mg via INTRAMUSCULAR
  Filled 2012-03-06: qty 2

## 2012-03-06 MED ORDER — SULFAMETHOXAZOLE-TRIMETHOPRIM 800-160 MG PO TABS: 1.0000 | ORAL_TABLET | Freq: Two times a day (BID) | ORAL | Status: AC

## 2012-03-06 MED ORDER — IBUPROFEN 600 MG PO TABS: 600.0000 mg | ORAL_TABLET | Freq: Four times a day (QID) | ORAL | Status: AC | PRN

## 2012-03-06 NOTE — MAU Note (Addendum)
On medication from clinic (provera), here today for burning when she urinates x 4 days, bilateral blank pain, pain 10/10 also has abd pain

## 2012-03-06 NOTE — Discharge Instructions (Signed)
Get your prescription filled and take all as directed. Return if you have worsening symptoms with fever, body aches, back ache or vomiting. Follow up with the GYN clinic as advised.

## 2012-03-06 NOTE — MAU Provider Note (Signed)
History     CSN: [id number]  Arrival date and time: 03/06/12 1352   First Provider Initiated Contact with Patient 03/06/12 1502      Chief Complaint  Patient presents with  . Back Pain  . Dysuria   HPI [facility] 31 y.o. Client of GYN clinic and [facility].  She is currently taking Provera (x5 days of 10 day course) for long term amenorrhea.  Having pain in Right side today.  Has not taken any pain medication.  Also having burning in vagina.    OB History    Grav Para Term Preterm Abortions TAB SAB Ect Mult Living   4 3 3  0 1 0 1 0 0 3      Past Medical History  Diagnosis Date  . Obesity 01/22/2012  . History of cesarean section     [id number]. Last 2 by c-section.     Past Surgical History  Procedure Date  . Cesarean section     x 2    Family History  Problem Relation Age of Onset  . Hyperlipidemia Father   . Diabetes type II Maternal Grandfather   . Anesthesia problems Neg Hx     History  Substance Use Topics  . Smoking status: Former Smoker    Types: Cigarettes    Quit date: 02/12/2012  . Smokeless tobacco: Never Used  . Alcohol Use: No    Allergies: No Known Allergies  Prescriptions prior to admission  Medication Sig Dispense Refill  . medroxyPROGESTERone (PROVERA) 10 MG tablet Take 1 tablet (10 mg total) by mouth daily.  10 tablet  0    Review of Systems  Gastrointestinal: Positive for abdominal pain. Negative for nausea, vomiting, diarrhea and constipation.  Genitourinary: Positive for dysuria.       No vaginal bleeding   Physical Exam   Blood pressure 124/67, pulse 78, temperature 98.4 F (36.9 C), resp. rate 20, weight 229 lb (103.874 kg).  Physical Exam  Nursing note and vitals reviewed. Constitutional: She is oriented to person, place, and time. She appears well-developed and well-nourished.       obese  HENT:  Head: Normocephalic.  Eyes: EOM are normal.  Neck: Neck supple.  GI: Soft. There is tenderness. There is no  rebound and no guarding.  Genitourinary:       Speculum exam: Vulva - negative Vagina - Small amount of creamy discharge, no odor Cervix - No contact bleeding Bimanual exam: Cervix closed Uterus mildly tender, exam limited by habitus More tenderness when palpating bladder Adnexa non tender,exam limited by habitus GC/Chlam, wet prep done Chaperone present for exam.  Musculoskeletal: Normal range of motion.  Neurological: She is alert and oriented to person, place, and time.  Skin: Skin is warm and dry.  Psychiatric: She has a normal mood and affect.    MAU Course  Procedures Toradol 60 mg IM for pain - decreased pain client was feeling, not totally relieved.  MDM Results for orders placed during the hospital encounter of 03/06/12 (from the past 24 hour(s))  URINALYSIS, ROUTINE W REFLEX MICROSCOPIC     Status: Abnormal   Collection Time   03/06/12  2:30 PM      Component Value Range   Color, Urine YELLOW  YELLOW    APPearance CLOUDY (*) CLEAR    Specific Gravity, Urine 1.025  1.005 - 1.030    pH 6.0  5.0 - 8.0    Glucose, UA NEGATIVE  NEGATIVE (mg/dL)   Hgb urine  dipstick LARGE (*) NEGATIVE    Bilirubin Urine NEGATIVE  NEGATIVE    Ketones, ur NEGATIVE  NEGATIVE (mg/dL)   Protein, ur NEGATIVE  NEGATIVE (mg/dL)   Urobilinogen, UA 0.2  0.0 - 1.0 (mg/dL)   Nitrite NEGATIVE  NEGATIVE    Leukocytes, UA SMALL (*) NEGATIVE   URINE MICROSCOPIC-ADD ON     Status: Abnormal   Collection Time   03/06/12  2:30 PM      Component Value Range   Squamous Epithelial / LPF FEW (*) RARE    WBC, UA TOO NUMEROUS TO COUNT  <3 (WBC/hpf)   RBC / HPF 21-50  <3 (RBC/hpf)   Bacteria, UA FEW (*) RARE   WET PREP, GENITAL     Status: Abnormal   Collection Time   03/06/12  3:35 PM      Component Value Range   Yeast Wet Prep HPF POC NONE SEEN  NONE SEEN    Trich, Wet Prep NONE SEEN  NONE SEEN    Clue Cells Wet Prep HPF POC NONE SEEN  NONE SEEN    WBC, Wet Prep HPF POC MANY (*) NONE SEEN      Assessment and Plan  UTI  Plan rx septra ds one po bid x 3 days (#6) Get your prescription filled and take all as directed. Return if you have worsening symptoms with fever, body aches, back ache or vomiting. Follow up with the GYN clinic as advised.   Holy Battenfield 03/06/2012, 3:06 PM

## 2012-03-07 ENCOUNTER — Encounter: Payer: Self-pay | Admitting: Family Medicine

## 2012-03-07 LAB — GC/CHLAMYDIA PROBE AMP, GENITAL: Chlamydia, DNA Probe: NEGATIVE

## 2012-03-09 NOTE — MAU Provider Note (Signed)
Agree with above note.  Julie [name] 03/09/2012 7:17 AM   

## 2012-03-27 ENCOUNTER — Encounter: Payer: Self-pay | Admitting: Obstetrics & Gynecology

## 2012-03-27 ENCOUNTER — Ambulatory Visit (INDEPENDENT_AMBULATORY_CARE_PROVIDER_SITE_OTHER): Payer: Self-pay | Admitting: Obstetrics & Gynecology

## 2012-03-27 VITALS — BP 104/71 | HR 75 | Temp 97.0°F | Resp 20 | Ht 65.0 in | Wt 227.1 lb

## 2012-03-27 DIAGNOSIS — B9689 Other specified bacterial agents as the cause of diseases classified elsewhere: Secondary | ICD-10-CM

## 2012-03-27 DIAGNOSIS — N760 Acute vaginitis: Secondary | ICD-10-CM

## 2012-03-27 DIAGNOSIS — A499 Bacterial infection, unspecified: Secondary | ICD-10-CM

## 2012-03-27 DIAGNOSIS — N912 Amenorrhea, unspecified: Secondary | ICD-10-CM

## 2012-03-27 MED ORDER — NORGESTIM-ETH ESTRAD TRIPHASIC 0.18/0.215/0.25 MG-25 MCG PO TABS: 1.0000 | ORAL_TABLET | Freq: Every day | ORAL | Status: DC

## 2012-03-27 MED ORDER — FLUCONAZOLE 150 MG PO TABS: 150.0000 mg | ORAL_TABLET | Freq: Once | ORAL | Status: AC

## 2012-03-27 NOTE — Progress Notes (Addendum)
Pt did not have [place] scheduled from last visit due to financial reasons. She now has the orange card and would like the [place] scheduled. Also, she has been having vaginal itching x5 days (since period ended). [id number] Patient's last menstrual period was 03/15/2012. [place] not rescheduled. Vaginal discharge and itching noted  Pelvic: nl EBUS, slight vaginal discharge. White, suspect yeast, non- tender, no mass Clinical Data: Evaluate ovarian cyst  TRANSABDOMINAL AND TRANSVAGINAL ULTRASOUND OF PELVIS  Technique: Both transabdominal and transvaginal ultrasound  examinations of the pelvis were performed including evaluation of  the uterus, ovaries, adnexal regions, and pelvic cul-de-sac.  Comparison: 05/20/2007  Findings: The uterus demonstrates a sagittal length of 9.0 cm, an  AP width of 3.7 cm and a transverse width of 5.5 cm. A homogeneous  uterine myometrium is seen. The endometrial stripe is  homogeneously echogenic with an AP width of 5.9 mm. No areas of  focal thickening or inhomogeneity are noted.  The left ovary has a normal appearance measuring 3.7 x 2.3 by 2.9  cm. The right ovary measures 4.3 x 2.9 by 3.8 cm and contains a  unilocular simple cyst measuring 2.3 x 8 to 0.8 x 2.0 cm. This is  significantly smaller than the simple cyst noted on the prior exam  and would correlate with this representing a benign finding. It is  unclear whether this is the same cyst that was seen on the previous  exam or may represent a dominant follicle. Given today's findings  and in comparison with multiple prior exams, this is felt clearly  to represent a benign process and no further follow-up is felt  necessary.  No pelvic fluid is seen.  IMPRESSION:  Normal uterine myometrium, endometrium and left ovary. Simple  cystic structure in the right ovary which may represent a follicle  on today's exam but is felt to represent a functional benign cyst  when compared with prior exams. No further follow-up is  felt  necessary.  Provider: Loreli Slot     Simple ovarian cyst. Yeast vaginitis Plan diflucan 150 mg po single dose  ARNOLD,JAMES

## 2012-03-27 NOTE — Progress Notes (Signed)
Addended by: Doreen Salvage on: 03/27/2012 03:46 PM   Modules accepted: Orders

## 2012-03-27 NOTE — Patient Instructions (Signed)
Informacin sobre los anticonceptivos orales  (Oral Contraception Information) Los anticonceptivos orales son medicamentos que se utilizan para evitar el embarazo. Su funcin es evitar que los ovarios liberen vulos. Las hormonas de los anticonceptivos orales hacen que el moco cervical se haga ms espeso, lo que evita que el esperma ingrese al tero. Tambin hacen que la membrana que tapiza el tero se vuelva ms fina, lo que no permite que el huevo fertilizado se adhiera a la pared del tero. Los anticonceptivos orales son muy efectivos cuando se toman exactamente como se prescriben. Sin embargo, no previenen contra las enfermedades de transmisin sexual (ETS). La prctica del sexo seguro, como el uso de preservativos, junto con la pldora, ayudan a prevenir ese tipo de enfermedades. Antes de tomar la pldora, usted debe hacerse un examen fsico y un test de Pap. El mdico podr indicar anlisis de sangre si es necesario. El mdico se asegurar de que usted es una buena candidata para usar anticonceptivos orales. Converse con su mdico acerca de los posibles efectos secundarios de los anticonceptivos orales. Cuando se inicia el uso de anticonceptivos orales, se pueden tomar durante 2 a 3 meses para que el cuerpo se adapte a los cambios en los niveles hormonales en el cuerpo.  HAY DOS TIPOS DE ANTICONCEPTIVOS ORALES  La pldora combinada. Esta pldora contiene las hormonas estrgeno y progestina (progesterona sinttica). La pldora combinada viene en envases para 21 das o 28 das. En los envases para 21 das, usted no tomar las pldoras durante 7 das despus de la ltima pldora. En los envases para 28 das, la pldora se toma todos los das. Las ltimas 7 no contienen hormonas. Ciertos tipos de pldoras tienen ms de 21 pldoras que contienen hormonas.   La minipldora. Esta pldora contiene la hormona progesterona solamente. Es necesario tomarla todos los das de manera continua. Viene en envases de 91  pldoras. Las primeras 84 contienen hormonas y las 7 ltimas no contienen. En los ltimos 7 das usted tendr su perodo menstrual. Puede experimentar manchado irregular.  VENTAJAS   Disminuye los sntomas premenstruales.   Se usa para tratar los clicos menstruales.   Regula el ciclo menstrual.   Disminuye el ciclo menstrual abundante.   Trata el acn.   Trata hemorragias uterinas anormales.   Trata el dolor plvico crnico.   Trata el sndrome ovrico poliqustico.   Trata la endometriosis.   Pueden usarse como anticonceptivos de emergencia  DESVENTAJAS  Pueden ser menos efectivos si:   Olvid tomar la pldora todos los das a la misma hora.   Usted tiene una enfermedad estomacal o intestinal que disminuye la absorcin de la pldora.   Toma anticonceptivos orales junto con otros frmacos que hacen que los anticonceptivos sean menos eficaces.   Usted toma anticonceptivos orales que han vencido.   Cuando se usa el envase de 21 das, se olvida de recomenzar el uso en el da 7.  Document Released: 09/13/2005 Document Revised: 11/23/2011 [employer] Patient Information 2012 [employer]. 

## 2012-03-28 LAB — WET PREP, GENITAL

## 2012-03-30 MED ORDER — METRONIDAZOLE 500 MG PO TABS: 500.0000 mg | ORAL_TABLET | Freq: Two times a day (BID) | ORAL | Status: AC

## 2012-03-30 NOTE — Progress Notes (Signed)
Addended by: Cormac Wint G on: 03/30/2012 04:28 PM   Modules accepted: Orders  

## 2012-04-01 ENCOUNTER — Telehealth: Payer: Self-pay | Admitting: Obstetrics and Gynecology

## 2012-04-01 NOTE — Telephone Encounter (Signed)
Called patient and notified of BV. Rx sent to [facility] in [address]. Pt states understanding.

## 2012-04-01 NOTE — Telephone Encounter (Signed)
Message copied by Toula Moos on Mon Apr 01, 2012 11:33 AM ------      Message from: Adam Phenix      Created: Sat Mar 30, 2012  4:13 PM       Wet prep pos clue cells. Needs Rx for BV. Flagyl was prescribed

## 2012-04-02 ENCOUNTER — Telehealth: Payer: Self-pay | Admitting: *Deleted

## 2012-04-02 NOTE — Telephone Encounter (Signed)
[facility] called from ph [phone] wants to know if can change birth control because it is too expensive.

## 2012-04-02 NOTE — Telephone Encounter (Signed)
Called [facility] pharmacist and was told that they do not carry the Rx that was ordered. (It is also too expensive @ other pharmacies due to no generic equivalent)  They do have Ortho tri-cyclen and want to know if they may substitute. I said that I will send a message to Dr. Debroah Loop.

## 2012-04-03 ENCOUNTER — Ambulatory Visit: Payer: Self-pay | Admitting: Family Medicine

## 2012-04-09 ENCOUNTER — Telehealth: Payer: Self-pay | Admitting: *Deleted

## 2012-04-09 DIAGNOSIS — N912 Amenorrhea, unspecified: Secondary | ICD-10-CM

## 2012-04-09 MED ORDER — NORGESTIM-ETH ESTRAD TRIPHASIC 0.18/0.215/0.25 MG-35 MCG PO TABS: 1.0000 | ORAL_TABLET | Freq: Every day | ORAL | Status: DC

## 2012-04-09 NOTE — Telephone Encounter (Signed)
Message copied by Jill Side on Tue Apr 09, 2012  3:52 PM ------      Message from: Adam Phenix      Created: Wed Apr 03, 2012  1:59 PM       OK to substitute ortho tricyclen

## 2012-04-09 NOTE — Telephone Encounter (Signed)
I called the [facility] pharmacy [phone]) and spoke w/Mila. I informed her that they may substitute the ortho tricyclen per Dr. Debroah Loop.  Rx changed on med list.

## 2012-04-25 ENCOUNTER — Encounter: Payer: Self-pay | Admitting: Family Medicine

## 2012-04-25 ENCOUNTER — Ambulatory Visit (INDEPENDENT_AMBULATORY_CARE_PROVIDER_SITE_OTHER): Payer: Self-pay | Admitting: Family Medicine

## 2012-04-25 VITALS — BP 119/74 | HR 76 | Temp 98.6°F | Ht 65.0 in | Wt 223.0 lb

## 2012-04-25 DIAGNOSIS — Z01419 Encounter for gynecological examination (general) (routine) without abnormal findings: Secondary | ICD-10-CM

## 2012-04-25 DIAGNOSIS — E669 Obesity, unspecified: Secondary | ICD-10-CM

## 2012-04-25 DIAGNOSIS — Z9189 Other specified personal risk factors, not elsewhere classified: Secondary | ICD-10-CM

## 2012-04-25 DIAGNOSIS — Z789 Other specified health status: Secondary | ICD-10-CM

## 2012-04-25 DIAGNOSIS — Z23 Encounter for immunization: Secondary | ICD-10-CM

## 2012-04-25 DIAGNOSIS — R82998 Other abnormal findings in urine: Secondary | ICD-10-CM

## 2012-04-25 DIAGNOSIS — R8290 Unspecified abnormal findings in urine: Secondary | ICD-10-CM | POA: Insufficient documentation

## 2012-04-25 LAB — LIPID PANEL
HDL: 40 mg/dL (ref >39)
Triglycerides: 234 mg/dL — ABNORMAL HIGH (ref <150)

## 2012-04-25 LAB — BASIC METABOLIC PANEL
CO2: 20 mEq/L (ref 19–32)
Calcium: 9.3 mg/dL (ref 8.4–10.5)
Chloride: 107 mEq/L (ref 96–112)
Glucose, Bld: 91 mg/dL (ref 70–99)
Sodium: 139 mEq/L (ref 135–145)

## 2012-04-25 NOTE — Assessment & Plan Note (Signed)
Patient states only pees once a day despite drinking lots of fluid. Will check BMET.

## 2012-04-25 NOTE — Patient Instructions (Signed)
Dear Julie [name],   It was great to see you today. Thank you for coming to clinic. Please read below regarding the issues that we discussed.   1. We checked your cholesterol today and checked you for diabetes.  2. We discussed ways to lose weight including:  1. Exercise 5 times per week for at least 35 minutes.   2. Eat 3 meals a day.   3. Try 1 new fruit each week to see if you can fit it in to your diet.   4. Try to eat at least 5 servings of fruits and vegetables each day.  3. You received your tetanus shot.   Please follow up in clinic in 4 weeks . Please call earlier if you have any questions or concerns.   Sincerely,  Dr. Tana Conch

## 2012-04-25 NOTE — Assessment & Plan Note (Signed)
Counseling provided today. AVS provides details of plan. COngratulated patient on 6 lbs weight loss since March.

## 2012-04-25 NOTE — Assessment & Plan Note (Signed)
a1c 5.8 today. LIfestyle changes per AVS. Follow up in 2 weeks.

## 2012-04-25 NOTE — Progress Notes (Signed)
  Subjective:    Patient ID: Julie [name], female    DOB: October 24, 1981, 30 y.o.   MRN: [id number]  HPI  Patient presenting for follow up of obesity and amenorrhea. Interpretation through [facility] # [id number]. Patient has obtained orange card and interested in "getting everything done" while she has it.   1. Obesity-patient concerned about developing HLD/Obesity. SHe expresses that she has been exercising 5x per week but is not losing weight. TOld patient down 6 lbs since March. Interested in losing weight. Understands risks for HLD/obesity and wants testing. She is very sad, frustrated that she is not losing weight. States importance is 10/10 and motivation is 12/10. Says she has tried lots of diets and has even looked into surgery. Sometimes it makes her not want to go out or look in the mirror.   Meal pattern: tries to only eat lunch to lose weight. Sometimes has cereal for breakfast or diner.  Physical activity:5x week. Walks everyday for 30 minutes. Quit doing Zumba which she was doing previously Foods you eat everyday/foods you avoid: everyday includes a stew that has rice beans, pumpkin, cactus, and potatoes. This is her main staple. Does not eat any fruits.   24-hr recall  B ( AM)- nothing Snk ( AM)- nothing L ( PM)-  stew Snk ( PM)- nothing D ( PM)- some more stew Snk ( PM)-  nothing   2. Health Maintenance- interested in TDAP today.   Review of Systems -See HPI  Past Medical History-smoking status noted: former smoker. Problem list reviewed.  Medications- reviewed and updated Chief complaint-noted  Objective:   Physical Exam  Constitutional: She is oriented to person, place, and time. She appears well-developed and well-nourished. No distress.       Obese   HENT:  Head: Normocephalic and atraumatic.  Right Ear: Tympanic membrane and ear canal normal.  Left Ear: Tympanic membrane and ear canal normal.  Mouth/Throat: Oropharynx is clear and moist. No oropharyngeal exudate.    Eyes: Conjunctivae and EOM are normal. Pupils are equal, round, and reactive to light.  Neck: Normal range of motion. Neck supple.  Cardiovascular: Normal rate and regular rhythm.  Exam reveals no gallop and no friction rub.   No murmur heard. Pulmonary/Chest: Effort normal and breath sounds normal. No respiratory distress. She has no wheezes. She has no rales.  Abdominal: Soft. Bowel sounds are normal.  Musculoskeletal: Normal range of motion. She exhibits no edema.  Lymphadenopathy:    She has no cervical adenopathy.  Neurological: She is alert and oriented to person, place, and time.  Skin: Skin is warm and dry.      Assessment & Plan:

## 2012-04-29 ENCOUNTER — Encounter: Payer: Self-pay | Admitting: Family Medicine

## 2012-04-29 DIAGNOSIS — E785 Hyperlipidemia, unspecified: Secondary | ICD-10-CM | POA: Insufficient documentation

## 2012-05-10 ENCOUNTER — Ambulatory Visit (INDEPENDENT_AMBULATORY_CARE_PROVIDER_SITE_OTHER): Payer: Self-pay | Admitting: Family Medicine

## 2012-05-10 ENCOUNTER — Encounter: Payer: Self-pay | Admitting: Family Medicine

## 2012-05-10 VITALS — BP 127/79 | HR 71 | Temp 98.3°F | Ht 65.0 in | Wt 222.0 lb

## 2012-05-10 DIAGNOSIS — Z789 Other specified health status: Secondary | ICD-10-CM

## 2012-05-10 DIAGNOSIS — Z9189 Other specified personal risk factors, not elsewhere classified: Secondary | ICD-10-CM

## 2012-05-10 DIAGNOSIS — R8290 Unspecified abnormal findings in urine: Secondary | ICD-10-CM

## 2012-05-10 DIAGNOSIS — R82998 Other abnormal findings in urine: Secondary | ICD-10-CM

## 2012-05-10 DIAGNOSIS — E781 Pure hyperglyceridemia: Secondary | ICD-10-CM

## 2012-05-10 DIAGNOSIS — F329 Major depressive disorder, single episode, unspecified: Secondary | ICD-10-CM

## 2012-05-10 DIAGNOSIS — E669 Obesity, unspecified: Secondary | ICD-10-CM

## 2012-05-10 NOTE — Patient Instructions (Signed)
Dear Julie [name],   It was great to see you again today. Please read below regarding the issues that we discussed.   1. For your lifestyle changes, please continue to focus on the following  1. Exercise 5 times per week for at least 35 minutes. I know this has been tough recently. I am happy for you that you have still been able to exercise 3 times each week.   2. Eat 3 meals a day. I am glad you have been able to eat twice. Please keep trying to eat breakfast.   3. Try to eat at least 5 servings of fruits and vegetables each day.  2. I would like for you to see our nutritionist and health coach. I think they can help you meet your goals. I will let them know I gave them your cards for you to call. Our health coach may call you.  3. I would like to see you back in a week to talk about depression as you inquired about medications.  4. Your labs look fine at this point. Your lifestyle goals will help them look even better in the future!   Please follow up in clinic in 1-2 week . Please call earlier if you have any questions or concerns.   Sincerely,  Dr. Tana Conch

## 2012-05-10 NOTE — Progress Notes (Signed)
Interpreter Wyvonnia Dusky for Dr Durene Cal [facility]

## 2012-05-11 DIAGNOSIS — E781 Pure hyperglyceridemia: Secondary | ICD-10-CM | POA: Insufficient documentation

## 2012-05-11 DIAGNOSIS — F329 Major depressive disorder, single episode, unspecified: Secondary | ICD-10-CM | POA: Insufficient documentation

## 2012-05-11 NOTE — Assessment & Plan Note (Signed)
LIfestyle changes per AVS. See problem list for obesity.

## 2012-05-11 NOTE — Assessment & Plan Note (Signed)
LIfestyle changes per AVS. WIll refer to Dr. Gerilyn Pilgrim and [name] for nutrition and health coaching.

## 2012-05-11 NOTE — Assessment & Plan Note (Signed)
Updated patient. Lifestyle changes per AVS. Wll recheck in 3-6 months.

## 2012-05-11 NOTE — Assessment & Plan Note (Signed)
Will get more comprehensive history at next visit and discuss medications vs therapy vs combo. PHQ9 of 17 with no SI/HI. Patient claims mainly related to not losing weight. This may actually be a barrier to patient's lifestyle changes and weight loss goals so will follow closely.

## 2012-05-11 NOTE — Progress Notes (Signed)
  Subjective:    Patient ID: Julie [name], female    DOB: 1981/09/29, 31 y.o.   MRN: [id number]  HPI  1. Obesity-patient only walking 3x per week for 30 minutes when previously walking 5x. Barrier for her has been her child recently had surgery. She has increased fruits/veggies to 3 per day with goal of 5. SHe tried several fruits and nothing seemed to work for her. Somewhat confusing as says she enjoys no fruits, yet able to eat 3 per day (few vegetables).   2. At risk for DM-updated patient on a1c of 5.8. SHe is willing to continue to make lifestlye changes.   3. Hypertriglyceridemia-discussed with patient that this could improve with lifestyle changes.   4. Depression-at end of visit patient states that she would like to take an antidepressant medication. Had patient fill out phq9 17. 3s noted for questions 3, 4,5. SHe says she is mainly discouraged about not losing weight and has lost joy that she previously had. Asked patient to schedule so we can continue to discuss at her next visit.    Review of Systems -See HPI  Past Medical History-smoking status noted: nonsmoker. Reviewed problem list.  Medications- reviewed and updated Chief complaint-noted      Objective:   Physical Exam  Constitutional: She is oriented to person, place, and time. She appears well-developed and well-nourished. No distress.  HENT:  Head: Normocephalic and atraumatic.  Neck: Normal range of motion. Neck supple.  Neurological: She is alert and oriented to person, place, and time.  Skin: Skin is warm and dry. She is not diaphoretic.  Psychiatric: Her behavior is normal. Judgment normal.       Flat affect. Mood described as down. Affect consistent. Normal thought content. Denies SI/HI.        Assessment & Plan:

## 2012-05-11 NOTE — Assessment & Plan Note (Signed)
Reviewed normal bmet with patient. She states she is still peeing only 1-2x per day.  WIll follow.

## 2012-05-23 ENCOUNTER — Ambulatory Visit (INDEPENDENT_AMBULATORY_CARE_PROVIDER_SITE_OTHER): Payer: Self-pay | Admitting: Family Medicine

## 2012-05-23 ENCOUNTER — Encounter: Payer: Self-pay | Admitting: Family Medicine

## 2012-05-23 VITALS — BP 112/75 | HR 85 | Temp 98.6°F | Ht 65.0 in | Wt 224.0 lb

## 2012-05-23 DIAGNOSIS — F329 Major depressive disorder, single episode, unspecified: Secondary | ICD-10-CM

## 2012-05-23 MED ORDER — FLUOXETINE HCL 10 MG PO CAPS: 10.0000 mg | ORAL_CAPSULE | Freq: Every day | ORAL | Status: DC

## 2012-05-23 NOTE — Patient Instructions (Addendum)
Dear Julie [name],   It was great to see you today. Thank you for coming to clinic.   1. For your depression, I would like to start you on a medication. See the prescription for places you can pick it up for $4.  2. Remember to call our office [phone] or go to an emergency room, if you have thoughts of hurting yourself or others. 3. I recommend that you meet with our psychologist, Dr. Spero Geralds, for help dealing with your depression.  You can schedule an appointment with her by calling her directly at [phone].  Please follow up in clinic in 3 weeks . Ask for appointment on June 26th.  Please call earlier if you have any questions or concerns.   Sincerely,  Dr. Tana Conch

## 2012-05-23 NOTE — Assessment & Plan Note (Addendum)
Severe depression over several years. Will start on prozac 10mg . Patient given places that she can pick up medicine for $4 monthly. Also referred to Dr. Pascal Lux as orange card patient.
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
## 2012-06-12 ENCOUNTER — Ambulatory Visit (INDEPENDENT_AMBULATORY_CARE_PROVIDER_SITE_OTHER): Payer: Self-pay | Admitting: Family Medicine

## 2012-06-12 ENCOUNTER — Encounter: Payer: Self-pay | Admitting: Family Medicine

## 2012-06-12 VITALS — BP 112/73 | HR 80 | Ht 65.0 in | Wt 222.0 lb

## 2012-06-12 DIAGNOSIS — IMO0002 Reserved for concepts with insufficient information to code with codable children: Secondary | ICD-10-CM

## 2012-06-12 DIAGNOSIS — F329 Major depressive disorder, single episode, unspecified: Secondary | ICD-10-CM

## 2012-06-12 DIAGNOSIS — E669 Obesity, unspecified: Secondary | ICD-10-CM

## 2012-06-12 DIAGNOSIS — N912 Amenorrhea, unspecified: Secondary | ICD-10-CM

## 2012-06-12 HISTORY — DX: Reserved for concepts with insufficient information to code with codable children: IMO0002

## 2012-06-12 MED ORDER — FLUOXETINE HCL 10 MG PO CAPS: 10.0000 mg | ORAL_CAPSULE | Freq: Every day | ORAL | Status: DC

## 2012-06-12 NOTE — Assessment & Plan Note (Signed)
Encouraged patient to continue lifestyle changes per previous AVS. She is also ready to meet with our health coach, [name]. Introduced to [name] and appointment scheduled.

## 2012-06-12 NOTE — Progress Notes (Signed)
  Subjective:    Patient ID: Julie [name], female    DOB: [birth year], 31 y.o.   MRN: [id number]  HPI [place] interpreter available during visit but patient preferred to speak in [place]. [place] used for clarification  1. Depression-patient states that her feelings of depression/mood are unchanged from last visit. States she lost her prescription and was unable to fill Prozac. Rx was printed so she could take to health department per her request. No thoughts of SI/HI. Patient would like another rx. Patient  2. Obesity-patient never reached our health coach or Dr. Gerilyn Pilgrim. She states that she called but did not reach anyone. Willing to meet with health coach today and [name] introduced herself to patient in the room and established goals for their future meetings. Weight down 2 lbs from last visit. Patient has resources from recent AVS for goals.   3. Amenorrhea states that she has had regular periods over last 3 months. Most recent period was lighter. She was given a rx for birth control by [facility]. She has it at home but never started taking it because she became regular again. She states most recent period lasted 6 days but was much lighter than normal so she was concerned. OB doctors discussed potential cancer and she wants to make sure this does not happen to her. She also states she had some abdominal pain midcycle 2 cycles ago. She is not sexually active with her husband-did not inquire why.   Review of Systems -See HPI  Past Medical History-smoking status noted: never smoker. Reviewed problem list.  Medications- reviewed and updated Chief complaint-noted     Objective:   Physical Exam Constitutional: She is oriented to person, place, and time. She appears well-developed and well-nourished. No distress.  Neurological: She is alert and oriented to person, place, and time.  Psychiatric: Her speech is normal. Her mood appears not anxious. Her affect is not inappropriate. She is  not slowed. She exhibits a depressed mood. She expresses no homicidal and no suicidal ideation. She expresses no suicidal plans and no homicidal plans.  Skin: brief skin exam with no signs of hirsutism on face     Assessment & Plan:
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
## 2012-06-12 NOTE — Patient Instructions (Signed)
Dear [name],   It was great to see you today. Thank you for coming to clinic.   1. For your depression, I would like to start you on a medication. See the prescription for places you can pick it up for $4.  2. Remember to call our office [phone] or go to an emergency room, if you have thoughts of hurting yourself or others. 3. I recommend that you meet with our psychologist, Dr. Spero Geralds, for help dealing with your depression.  You can schedule an appointment with her by calling her directly at [phone]. 4. Please let me know if you are not having periods on a monthly basis since you are not taking birth control pills 5. I look forward to seeing you along with [name], our health coach.   Please follow up in clinic in 2 weeks . Please call earlier if you have any questions or concerns.   Sincerely,  Dr. Tana Conch

## 2012-06-12 NOTE — Assessment & Plan Note (Addendum)
Has been followed by OB/GYN. Patient says received provera at one point and that she has been regular for last 3 months. She has been noncompliant with OCPs.  Lighter period last month. Will follow periods over next few months and strongly encourage OCPs if she does not continue to have periods in order to prevent development of endometrial cancer.   Given history oligomenorrhea/amenorhea, a1c 5.8-possible PCOS. Also just possibly due to long term obesity. No signs of hypertestosterone on previous exams-could consider testosterone level at future visit. Ultrasound did not reveal multiple cysts when evaluated by ob/gyn.

## 2012-07-03 ENCOUNTER — Ambulatory Visit: Payer: Self-pay | Admitting: Family Medicine

## 2012-07-03 ENCOUNTER — Ambulatory Visit: Payer: Self-pay | Admitting: Home Health Services

## 2012-07-15 ENCOUNTER — Ambulatory Visit (INDEPENDENT_AMBULATORY_CARE_PROVIDER_SITE_OTHER): Payer: Self-pay | Admitting: Family Medicine

## 2012-07-15 ENCOUNTER — Encounter: Payer: Self-pay | Admitting: Family Medicine

## 2012-07-15 VITALS — BP 100/65 | HR 76 | Ht 65.0 in | Wt 221.0 lb

## 2012-07-15 DIAGNOSIS — E669 Obesity, unspecified: Secondary | ICD-10-CM

## 2012-07-15 DIAGNOSIS — N912 Amenorrhea, unspecified: Secondary | ICD-10-CM

## 2012-07-15 DIAGNOSIS — F329 Major depressive disorder, single episode, unspecified: Secondary | ICD-10-CM

## 2012-07-15 NOTE — Patient Instructions (Addendum)
Dear Julie [name],   It was great to see you again today. Thank you for coming to clinic. Please read below regarding the issues that we discussed.   1. I am glad you are doing so well with the medication. Please keep taking it as prescribed.  2. Make sure to make an appointment with [name] and me at the front. You set a goal to try to eat breakfast as well.  3. Please make an appointment so we can discuss your knee pain.    Sincerely,  Dr. Tana Conch

## 2012-07-16 NOTE — Progress Notes (Signed)
Subjective:   1. Depression follow up-patient reports taking prozac regularly without side effects she states that her mood has greatly improved. She says she has been doing her hair differently, dressing nicer, and feels much better about herself. She is very happy with medicine and it's result.   PHQ 9 scoring down to a 5 specifically with score 0 fro question 9 and no score >1.   2. Obesity follow up-missed appointment with health coach. States she is walking about once every 2-3 days. She has been eating 2 meals per day and considering trying to eat breakfast.  Weight down 1 lb today and she is pleased and overall down 8 lbs from peak.  Eating 2 servings of fruits or veggies outside of soup she regularly eats which contains veggies.   3. Amenorrhea-resolved, continues to have regular periods.   ROS--See HPI  Past Medical History-smoking status noted: nonsmoker.  Reviewed problem list.  Medications- reviewed and updated Chief complaint-noted  Objective:  Gen: NAD CV: RRR no mrg Lungs: CTAB MSK: Skin:   Assessment/Plan: See problem oriented charted Additionally patient plans to make an appointment for knee pain which she has had ove the last year. Describes swelling about every 15 days which makes it difficult for her to walk or bend her leg but no current issues, pain, swelling at this time.

## 2012-07-16 NOTE — Assessment & Plan Note (Signed)
Continue Prozac given drastic improvement and patient satisfaction.

## 2012-07-16 NOTE — Assessment & Plan Note (Signed)
Will monitor. Continue to consider OCP if irregular in the future.

## 2012-07-16 NOTE — Assessment & Plan Note (Signed)
Patient to reschedule appointment with [name] as her son was sick and she was unable to come to last appointment. Up to to meals a day with goal of 3 meals a day-patient wiling to try at this time.Goal 5 servings fruits/veggies-patient working towards with fruits/veggies outside of soup.

## 2012-07-23 ENCOUNTER — Ambulatory Visit: Payer: Self-pay | Admitting: Family Medicine

## 2012-07-23 ENCOUNTER — Ambulatory Visit (INDEPENDENT_AMBULATORY_CARE_PROVIDER_SITE_OTHER): Payer: Self-pay | Admitting: Family Medicine

## 2012-07-23 ENCOUNTER — Encounter: Payer: Self-pay | Admitting: Family Medicine

## 2012-07-23 VITALS — BP 116/77 | HR 79 | Temp 98.2°F | Ht 65.0 in | Wt 220.0 lb

## 2012-07-23 DIAGNOSIS — F329 Major depressive disorder, single episode, unspecified: Secondary | ICD-10-CM

## 2012-07-23 DIAGNOSIS — E669 Obesity, unspecified: Secondary | ICD-10-CM

## 2012-07-23 DIAGNOSIS — M1990 Unspecified osteoarthritis, unspecified site: Secondary | ICD-10-CM | POA: Insufficient documentation

## 2012-07-23 MED ORDER — MELOXICAM 7.5 MG PO TABS: 7.5000 mg | ORAL_TABLET | Freq: Every day | ORAL | Status: DC

## 2012-07-23 MED ORDER — FLUOXETINE HCL 20 MG PO CAPS: 20.0000 mg | ORAL_CAPSULE | Freq: Every day | ORAL | Status: DC

## 2012-07-23 NOTE — Progress Notes (Signed)
Subjective:   1. Knee Pain- increasing pain over last year. Pain on daily basis worse with walking or if squatting or down on knees. Approximately twice a month pain will flare up to 9/10 and last for 3-4 days. Worse with bending knee and feels like leg is swelling. Patient states Advil and tylenol helps but icing with minimal relief. Patient  2. Depression follow up-PHQ9 scoring 9. She admits that overall she feels much better but still has fluctuations in her mood and spirits. Interested in increasing medicine.   3. Obesity-patient continues to try to lose weight. Weight down 9 lbs from peak. Still hasn't seen [name] yet but still planning on visit. Still able to eat 2 meals but not up to 3rd. Will continue efforts. Exercise limited by knee pain.    ROS--See HPI  Past Medical History-smoking status noted: nonsmoker.  Reviewed problem list.  Medications- reviewed and updated Chief complaint-noted  Objective:  Gen: NAD CV: RRR no mrg Lungs: CTAB NGE:[id number] to inspection with no erythema or effusion or obvious bony abnormalities. Palpation normal with no warmth, patellar tenderness. Patient diffusely tender along bilateral joint lines and along perimeter of patella adn with compression. No gross effusion noted.  ROM full in flexion (but did have pain with flexion)  and extension and lower leg rotation. Crepitus noted with flexion.  Ligaments with solid consistent endpoints including ACL, PCL, LCL, MCL. Negative anterior and posterior drawer.  Patellar and quadriceps tendons unremarkable. Hamstring and quadriceps strength is normal.    Assessment/Plan: See problem oriented charted

## 2012-07-23 NOTE — Assessment & Plan Note (Signed)
Patient to continue weight loss efforts-believe this will help with knee pain. Once again referred to life coach.

## 2012-07-23 NOTE — Patient Instructions (Addendum)
Dear Julie [name],   It was great to see you today. Thank you for coming to clinic. Please read below regarding the issues that we discussed.   1. I have increased your Prozac to 20mg  and refilled it.  2. Please remember to make an appointment with [name].  3. Your knee pain is likely osteoarthritis. Losing weight will help-keep up the great job. I have sent in a medication to calm down the pain on a daily basis. Also, when things flare up, you can take tylenol 650mg  every 6 hours as needed. I want you to keep walking but don't overdo it. Try to walk at least 3x per week. The medicine should help with the pain.   Please follow up in clinic in 6 weeks to follow up depression. Please call earlier if you have any questions or concerns.   Sincerely,  Dr. Tana Conch  Weights as requested Vitals - 1 value per visit 07/23/2012 07/15/2012 06/12/2012 05/23/2012 05/10/2012  Weight (lb) 220 221 222 224 222   Vitals - 1 value per visit 04/25/2012 03/27/2012 03/06/2012  Weight (lb) 223 227.1 229    Degenerative Arthritis You have osteoarthritis. This is the wear and tear arthritis that comes with aging. It is also called degenerative arthritis. This is common in people past middle age. It is caused by stress on the joints. The large weight bearing joints of the lower extremities are most often affected. The knees, hips, back, neck, and hands can become painful, swollen, and stiff. This is the most common type of arthritis. It comes on with age, carrying too much weight, or from an injury. Treatment includes resting the sore joint until the pain and swelling improve. Crutches or a walker may be needed for severe flares. Only take over-the-counter or prescription medicines for pain, discomfort, or fever as directed by your caregiver. Local heat therapy may improve motion. Cortisone shots into the joint are sometimes used to reduce pain and swelling during flares. Osteoarthritis is usually not  crippling and progresses slowly. There are things you can do to decrease pain:  Avoid high impact activities.   Exercise regularly.   Low impact exercises such as walking, biking and swimming help to keep the muscles strong and keep normal joint function.   Stretching helps to keep your range of motion.   Lose weight if you are overweight. This reduces joint stress.  In severe cases when you have pain at rest or increasing disability, joint surgery may be helpful. See your caregiver for follow-up treatment as recommended.   SEEK IMMEDIATE MEDICAL CARE IF:    You have severe joint pain.   Marked swelling and redness in your joint develops.   You develop a high fever.  Document Released: 12/04/2005 Document Revised: 11/23/2011 Document Reviewed: 05/06/2007 [facility] Patient Information 2012 [place], [place].  Artritis Degenerativa (Degenerative Arthritis) Usted padece osteoatritis. La causa es el desgaste que se produce con el paso de [place]. Tambin se denomina artritis degenerativa. Es un trastorno Chief Strategy Officer que han pasado la edad [place]. La causa es el estrs que sufren las articulaciones a lo largo de la vida. Las extremidades inferiores son las ms afectadas cuando deben soportar sobrepeso. Las rodillas, caderas, espalda, cuello y manos comienzan a Cabin crew, se hinchan y se tornan rgidos. Este es el tipo ms frecuente de artritis. Aparece como consecuencia del paso de los [place], [place] se debe soportar sobrepeso o luego de alguna lesin. El tratamiento incluye el reposo de la  articulacin dolorida Insurance account manager y la hinchazn mejoren. En los casos extremos puede ser necesario utilizar muletas o un andador. Utilice los medicamentos de venta libre o de prescripcin para Chief Technology Officer, Environmental health practitioner o la [place], segn se lo indique el profesional que lo asiste. La terapia con calor localizada puede mejorar los movimientos. En algunos casos se utilizan inyecciones de  corticoides para Engineer, materials y la hinchazn en los casos agudos. En general la osteoartritis no es grave y Systems analyst. Para Engineer, materials usted puede:  Evitar las actividades de alto impacto.   Practicar ejercicios con regularidad.   Los ejercicios de bajo impacto como las caminatas, Scientist, research (physical sciences) y natacin ayudan a Pharmacologist la fortaleza de los msculos y al normal funcionamiento de Nurse, learning disability.   Los ejercicios de elongacin son Physicist, medical la amplitud de los movimientos   [place] de peso si es necesario. Esto reduce el estrs Camera operator.  En los casos graves, cuando el dolor contina durante el reposo o aumenta la discapacidad, podr requerir [place]. Comunquese con los profesionales que lo asisten para Education officer, environmental un seguimiento segn las indicaciones.   SOLICITE ATENCIN MDICA DE INMEDIATO SI:  Siente dolor intenso en la articulacin.   Observa una notoria hinchazn o enrojecimiento   Sube la fiebre.  Document Released: 12/04/2005 Document Revised: 11/23/2011 [facility] Patient Information 2012 [place], [place].

## 2012-07-23 NOTE — Assessment & Plan Note (Signed)
Exam consistent with osteoarthritis. No midline tenderness to favor prepatellar bursitis. Trial Mobic daily with tylenol during flares. Encouraged patient to continue exercise but not overdue it.

## 2012-07-23 NOTE — Assessment & Plan Note (Signed)
Due to fluctuations in mood will increase Prozac to 20mg . Patient to follow up in 6 weeks.

## 2012-09-02 ENCOUNTER — Ambulatory Visit: Payer: Self-pay | Admitting: Family Medicine

## 2012-10-07 ENCOUNTER — Ambulatory Visit (INDEPENDENT_AMBULATORY_CARE_PROVIDER_SITE_OTHER): Payer: Self-pay | Admitting: Family Medicine

## 2012-10-07 ENCOUNTER — Encounter: Payer: Self-pay | Admitting: Family Medicine

## 2012-10-07 VITALS — BP 111/69 | HR 69 | Temp 98.4°F | Ht 65.0 in | Wt 219.0 lb

## 2012-10-07 DIAGNOSIS — M1990 Unspecified osteoarthritis, unspecified site: Secondary | ICD-10-CM

## 2012-10-07 DIAGNOSIS — R21 Rash and other nonspecific skin eruption: Secondary | ICD-10-CM | POA: Insufficient documentation

## 2012-10-07 MED ORDER — TRIAMCINOLONE ACETONIDE 0.1 % EX CREA: TOPICAL_CREAM | Freq: Two times a day (BID) | CUTANEOUS | Status: DC

## 2012-10-07 NOTE — Progress Notes (Signed)
Subjective:   1. Knee pain chronic-patient diagnosed with arthritis previously and appears to be worsening. States that pain is now constant if she is on her feet throughout the month and does not come in waves. State s10/10 pain on both legs right slightly worse than left. Mobic, tylenol, ibuprofen do not give relief. . Worsened with squatting down or bending legs (states feels like legs are creaking and grinding together). Denies nausea/vomiting/fever/chills/fatigue/overall sick feelings/weight loss. Denies knee giving out, , redness, loss of sensation or muscle weakness. Does report that her knees tend to swell.     2. Rash under arms-darkening of skin noted 3 weeks ago. Has tried different soaps and deodorants and trying no deodorant. Also tried antifungals. Not immunosuppressed, no overall sick feelings fr fevers or oral involvement, not immunosuppressed. Very pruritic since it has turned darker. Also doesn't like appearance.    ROS--See HPI  Past Medical History-smoking status noted: nonsmoker.  Reviewed problem list.  Medications- reviewed and updated Chief complaint-noted  Objective: BP 111/69  Pulse 69  Temp 98.4 F (36.9 C)  Ht 5\' 5"  (1.651 m)  Wt 219 lb (99.338 kg)  BMI 36.44 kg/m2 Gen: NAD CV: RRR no mrg Lungs: CTAB Skin: hyperpigmentation in both axilla that is confluent. No excoriations noted. No raised skin.  Knee exam: normal exam, no swelling, tenderness noted along above and below medial joint line, instability; ligaments intact- FROM, negative drawer sign, collateral ligaments intact, normal ipsilateral hip exam. Some pain with valgus testing. Profound crepitus noted.    Assessment/Plan: See problem oriented charted

## 2012-10-07 NOTE — Patient Instructions (Signed)
We will consider knee injections once you get the orange card.  For your itchy rash, try Benadryl 25 mg every 8 hours for the itching and use the cream I sent in for you twice a day for 2 weeks, come back if not improving.

## 2012-10-07 NOTE — Assessment & Plan Note (Signed)
Appears to be acanthosis nigricans. Unclear why this is itching patient. Could have element of contact dermatitis. Instructed patient to discontinue all soaps and perfumes and deodorants for 2 weeks and use triamcinolone only BID. Will see back if not improving. Does not have fungal appearance but would use KOH prep at next visit and consider antifungal if not improved. Acanthosis nigricans would be consistent with patient being at riskf or DM with a1c of 5.8.

## 2012-10-07 NOTE — Assessment & Plan Note (Addendum)
Plan was for injection of knee today but patient without insurance. Will try to get orange card then present again for injection. If no improvement with injection, will send to sports medicine for further eval. Patient does seem somewhat young for severity of crepitus but given obesity with BMI> 35, expect this is simply arthritis. Only things that make me think otherwise are pain with valgus testing (no instability) and pain being near medial joint line. No redness, erythema, fevers to suggest infected joint.

## 2012-12-02 ENCOUNTER — Encounter: Payer: Self-pay | Admitting: Family Medicine

## 2012-12-02 ENCOUNTER — Ambulatory Visit (HOSPITAL_COMMUNITY)
Admission: RE | Admit: 2012-12-02 | Discharge: 2012-12-02 | Disposition: A | Discharge status: Home / Self Care | Payer: No Typology Code available for payment source | Source: Ambulatory Visit | Attending: Family Medicine | Admitting: Family Medicine

## 2012-12-02 ENCOUNTER — Ambulatory Visit (INDEPENDENT_AMBULATORY_CARE_PROVIDER_SITE_OTHER): Payer: No Typology Code available for payment source | Admitting: Family Medicine

## 2012-12-02 VITALS — BP 106/71 | HR 73 | Ht 65.0 in | Wt 222.0 lb

## 2012-12-02 DIAGNOSIS — R21 Rash and other nonspecific skin eruption: Secondary | ICD-10-CM

## 2012-12-02 DIAGNOSIS — M25569 Pain in unspecified knee: Secondary | ICD-10-CM | POA: Insufficient documentation

## 2012-12-02 DIAGNOSIS — M1990 Unspecified osteoarthritis, unspecified site: Secondary | ICD-10-CM

## 2012-12-02 MED ORDER — FLUCONAZOLE 150 MG PO TABS: 150.0000 mg | ORAL_TABLET | Freq: Every day | ORAL | Status: DC

## 2012-12-02 MED ORDER — TERBINAFINE HCL 250 MG PO TABS: 250.0000 mg | ORAL_TABLET | Freq: Every day | ORAL | Status: DC

## 2012-12-02 MED ORDER — LORATADINE 10 MG PO TABS: 10.0000 mg | ORAL_TABLET | Freq: Every day | ORAL | Status: DC

## 2012-12-02 NOTE — Patient Instructions (Signed)
Get your knee x-rays. Come back as soon as you can after this weekas we will likely plan to inject your knees.  For your rash, we will try a stronger antifungal. Take Claritin daily to reduce your itching. Only use benadryl as needed. Do not use deodorant. Dial soap only to your armpits.   Thanks and see you within the month,  Dr. Durene Cal

## 2012-12-02 NOTE — Progress Notes (Signed)
Subjective:   1. Knee pain chronic-pain x 1 year. Previously worse every few weeks but now constant pain. Makes it difficult to sleep but does not wake from sleep. 10/10 aching pain in both knees worse on medial side. No radiation. No numbness/tingling in feet and no pain in hip. Pain worse with walking but will still feel it at rest. Associated with some swelling. Mobic, tylenol, ibuprofen do not give relief but still tries these occasionally. Worsened with squatting down or bending legs (states feels like legs are creaking and grinding together) and even says feels like "bone on bone".  Denies nausea/vomiting/fever/chills/fatigue/overall sick feelings/weight loss/warmth of joint/knee giving out/locking/popping/redness/loss of sensation/muscle weakness.   2. Rash under arms-darkening of skin since early October. Associated with intense pruritis that sometimes wakes from sleep. Seen mid October and tried triamcinolone for 3 weeks. Had previously tried antifungals. Has been off of deodorant since October.  Not immunosuppressed, no overall sick feelings/ fevers/ oral involvement. Does say she will feel a very slight short lived <1 minute pain in breast sides of breasts up to 4x per day. No nipple discharge or changes of skin around nipple. Embarrassed to go in public because of her scratching and no deodorant. Benadryl at home for itching.   ROS--See HPI  Past Medical History-smoking status noted: nonsmoker.  Reviewed problem list.  Medications- reviewed and updated Chief complaint-noted  Objective: BP 106/71  Pulse 73  Ht 5\' 5"  (1.651 m)  Wt 222 lb (100.699 kg)  BMI 36.94 kg/m2 Gen: NAD, does occasionally reach under Skin: hyperpigmentation in both axilla that is confluent. No excoriations noted. Some small papules. No scaling noted.  No raised skin. Similar hyperpigmentation but much lighter around neck.  Knee exam: no joint effusion. tenderness noted along above and below medial joint line,  instability; ligaments intact- FROM, negative drawer sign, collateral ligaments intact. Negative McMurray. Profound crepitus noted.    Assessment/Plan: See problem oriented charted

## 2012-12-02 NOTE — Assessment & Plan Note (Signed)
Continues to gradually worsen. Will send for knee films today. Plan for diagnostic and therapeutic injection at next visit if no other etiology of pain discovered on films.

## 2012-12-02 NOTE — Assessment & Plan Note (Addendum)
Once again, area appears to be acanthosis nigricans. Patient with a1c showing insulin resistance but no diabetes with plan to follow yearly a1c (next may).  Consulted with Dr. Leveda Anna who saw patient and agreed with assessment. Patient with no improvement with triamcinolone and benadryl. Do not see area that we could get a good sample for KOH scraping so deferred. Will trial terbinafine x 2 weeks and patient will return at that time to see if fungal element to rash.   Do not see signs of contact dermatitis and patient with no application of deodorant and only using dial soap. Could consider patch testing.   Discussed with patient possibility of referral vs. Biopsy if not improved. She would lean towards referral given only covered by orange card for 6 months at this time.

## 2012-12-16 ENCOUNTER — Ambulatory Visit (INDEPENDENT_AMBULATORY_CARE_PROVIDER_SITE_OTHER): Payer: No Typology Code available for payment source | Admitting: Family Medicine

## 2012-12-16 ENCOUNTER — Ambulatory Visit: Payer: No Typology Code available for payment source | Admitting: Family Medicine

## 2012-12-16 ENCOUNTER — Encounter: Payer: Self-pay | Admitting: Family Medicine

## 2012-12-16 VITALS — BP 116/75 | HR 71 | Ht 65.0 in | Wt 225.0 lb

## 2012-12-16 DIAGNOSIS — M1990 Unspecified osteoarthritis, unspecified site: Secondary | ICD-10-CM

## 2012-12-16 DIAGNOSIS — R21 Rash and other nonspecific skin eruption: Secondary | ICD-10-CM

## 2012-12-16 NOTE — Patient Instructions (Addendum)
Come back and see me within the month if the armpits are not better.    Inyeccin en la rodilla (Knee Injection) Pueden aplicarse inyecciones en la rodilla. El Field seismologist una aguja en la articulacin. La aguja es necesaria para colocar un medicamento en la articulacin. Se utilizan para tratar diferentes enfermedades que causan dolor en la rodilla, como osteoartritis, bursitis, rebrotes locales de artritis reumatoidea y pseudogota. Los [employer] corticoides y los anestsicos son los medicamentos que con ms frecuencia se Banker las articulaciones y tejidos blandos de modo inyectable.  PROCEDIMIENTO  Debe limpiarse la piel que se encuentra sobre la rtula con una solucin antisptica.  El mdico inyectar una pequea cantidad de anestsico local (medicamento como la [place]) justo por debajo de la piel, en la zona que fue higienizada.  Luego que el rea est adormecida, se coloca una segunda inyeccin. Esta segunda inyeccin generalmente contiene un anestsico y un antiinflamatorio denominado corticoide o cortisona. La aguja se coloca con cuidado entre la rtula y la rodilla, y se Scientist, physiological en el espacio de la articulacin.  Luego de [employer], se retira la aguja. El mdico colocar un vendaje sobre el sitio de la inyeccin. El procedimiento no lleva ms de un par de minutos. ANTES DEL PROCEDIMIENTO Lave toda la piel alrededor de la zona de la rodilla. Trate de retirar la piel suelta. No es necesario ninguna otra preparacin especfica excepto que el mdico le indique otra cosa. COMENTE CON SU MDICO SI:  Sufre alergias  Medicamentos que toma, incluyendo hierbas, gotas oftlmicas, medicamentos de venta libre y cremas  Uso de esteroides (por va oral o cremas)  Test de embarazo (si corresponde).  Problemas anteriores debido a anestsicos o a Patent examiner.  Antecedentes de haber tenido cogulos sanguneos (tromboflebitis)  Antecedentes  de hemorragias o problemas sanguneos  Cirugas previas.  Otros problemas de salud RIESGOS Y COMPLICACIONES Los efectos adversos de la cortisona son raros. Ellos son:   Hematomas leves.  Reduccn del tejido graso normal bajo la piel en la que se aplic la inyeccin.  Aumento del dolor luego de la inyeccin.  Infecciones  Debilidad o ruptura de los tendones.  Reaccin alrgica al medicamento.  Los diabticos tienen un aumento temporario de International aid/development worker en la sangre luego de una inyeccin.  Los corticoides pueden debilitar el sistema inmunolgico por un tiempo. Mientras se reciben estas inyecciones, no debe recibir algunas vacunas. Tambin evite el contacto con toda persona que tenga varicela o paperas. Especialmente si nunca ha sufrido estas enfermedades o no ha sido vacunado previamente. Podra ser que su sistema inmunolgico no sea lo suficientemente fuerte como para Production manager contra una infeccin mientras recibe cortisona. DESPUES DEL PROCEDIMIENTO  Podr volver a su casa despus del procedimiento.  Coloque hielo en la articulacin durante 15 a 20 minutos cada 3  4 horas hasta que el dolor desaparezca.  Podra ser necesario que coloque un vendaje elstico en la articulacin. INSTRUCCIONES PARA EL CUIDADO DOMICILIARIO  Slo tome medicamentos de venta libre o prescriptos para Primary school teacher, las molestias, o bajar la fiebre segn las indicaciones de su mdico.  Evite el estrs sobre la articulacin. Evite las actividades que ejerzan presin sobre la articulacin de la rodilla como:  Garment/textile technologist en bicicleta  Alpinismo recreativo  Caminatas  Recustese y [employer] la pierna sobre el nivel del corazn para minimizar la hinchazn. SOLICITE ATENCIN MDICA SI:  La hinchazn vuelve luego de haberse ido o empeora.  Brett Fairy  secrecin en la zona de la puncin.  Observa una lnea roja que se extiende por arriba o por debajo del sitio en el que le insertaron la aguja. SOLICITE  ATENCIN MDICA DE INMEDIATO SI:  Le sube la fiebre.  Siente un dolor que empeora an tomando antibiticos.  La zona est roja y caliente y tiene problemas para Surveyor, minerals. ASEGRESE QUE:   Comprende estas instrucciones.  Controlar su enfermedad.  Solicitar ayuda de inmediato si no mejora o si empeora. Document Released: 03/22/2009 Document Revised: 02/26/2012 [facility] Patient Information 2013 [place], [place].

## 2012-12-17 NOTE — Assessment & Plan Note (Signed)
Once again, appears to be acanthosis nigricans consistent with insulin resistance (not DM yet) noted on A1c. No improvement with terbinafine x 2 weeks, no history of improvement with triamcinolone. Had discussed with patient giving it another 2 weeks but given that she has orange card for only 6 months currently, will attempt to get her into dermatology at this time.

## 2012-12-17 NOTE — Progress Notes (Signed)
Subjective:   1. Rash/acanthosis nigricans-patient presents for follow up of rash. She has been taking terbinafine for 2 weeks as well as claritin. She describes the itching is still constant and she has only felt minimal improvement with the claritin. No fever/chills/nausea/vomiting/polyuria/polydipsia/excessive thirst.   2. OA-patient also with planned bilateral knee injections for OA to allow her to move enough to exercise to help with weight loss. Reviewed x-rays with patient which showed early degenerative changes R >L. Once again patient denies any locking, popping, or giving way.   ROS--See HPI  Past Medical History Patient Active Problem List  Diagnosis  . Amenorrhea  . Obesity, Class II, BMI 35-39.9  . Hyperlipidemia LDL goal < 160  . Depression  . Osteoarthritis    Reviewed problem list.  Medications- reviewed and updated Chief complaint-noted  Objective: BP 116/75  Pulse 71  Ht 5\' 5"  (1.651 m)  Wt 225 lb (102.059 kg)  BMI 37.44 kg/m2  LMP 12/02/2012 Gen: NAD, does occasionally reach under Skin: hyperpigmentation and thickened skin in both axilla that is confluent and unchanged from previous. No excoriations noted. Some small papules. No scaling noted.  No raised skin. Similar hyperpigmentation but much lighter around neck.  Knee exam: no joint effusion. tenderness noted along above and below medial joint line, no joint  instability; ligaments intact- FROM, negative anterior and posterior drawer drawer sign, collateral ligaments intact. Negative McMurray. Mild crepitus noted, less than previous   Consent obtained and verified. Cleansed bilateral knees with Alcohol  Topical analgesic spray: Ethyl chloride. Joint: Knee Approached in typical fashion with: anterolateral approach Completed without difficulty Meds: 1cc lidocaine 1%, 4cc steroid Needle:25 gauge Aftercare instructions and Red flags advised.  Assessment/Plan: See problem oriented charted

## 2012-12-17 NOTE — Assessment & Plan Note (Signed)
Diagnostic and therapeutic injection today. Patient with immediate improvement in symptoms after injection but could not quantify how much.

## 2012-12-31 ENCOUNTER — Ambulatory Visit: Payer: No Typology Code available for payment source | Admitting: Family Medicine

## 2013-01-02 ENCOUNTER — Ambulatory Visit (INDEPENDENT_AMBULATORY_CARE_PROVIDER_SITE_OTHER): Payer: No Typology Code available for payment source | Admitting: Family Medicine

## 2013-01-02 ENCOUNTER — Encounter: Payer: Self-pay | Admitting: Family Medicine

## 2013-01-02 ENCOUNTER — Ambulatory Visit (HOSPITAL_COMMUNITY)
Admission: RE | Admit: 2013-01-02 | Discharge: 2013-01-02 | Disposition: A | Discharge status: Home / Self Care | Payer: No Typology Code available for payment source | Source: Ambulatory Visit | Attending: Family Medicine | Admitting: Family Medicine

## 2013-01-02 VITALS — BP 104/69 | HR 67 | Temp 97.0°F | Ht 65.0 in | Wt 221.0 lb

## 2013-01-02 DIAGNOSIS — R059 Cough, unspecified: Secondary | ICD-10-CM | POA: Insufficient documentation

## 2013-01-02 DIAGNOSIS — J40 Bronchitis, not specified as acute or chronic: Secondary | ICD-10-CM

## 2013-01-02 DIAGNOSIS — R05 Cough: Secondary | ICD-10-CM | POA: Insufficient documentation

## 2013-01-02 DIAGNOSIS — R079 Chest pain, unspecified: Secondary | ICD-10-CM | POA: Insufficient documentation

## 2013-01-02 DIAGNOSIS — N912 Amenorrhea, unspecified: Secondary | ICD-10-CM

## 2013-01-02 DIAGNOSIS — R0602 Shortness of breath: Secondary | ICD-10-CM | POA: Insufficient documentation

## 2013-01-02 DIAGNOSIS — M1990 Unspecified osteoarthritis, unspecified site: Secondary | ICD-10-CM

## 2013-01-02 MED ORDER — IPRATROPIUM BROMIDE HFA 17 MCG/ACT IN AERS: 2.0000 | INHALATION_SPRAY | Freq: Four times a day (QID) | RESPIRATORY_TRACT | Status: DC

## 2013-01-02 MED ORDER — DM-GUAIFENESIN ER 30-600 MG PO TB12: 1.0000 | ORAL_TABLET | Freq: Two times a day (BID) | ORAL | Status: DC

## 2013-01-02 NOTE — Assessment & Plan Note (Addendum)
Given wheeze, CXR without signs atypical pneumonia, afebrile-suspect bronchitis. Treat with ipratropium, guafenesin-dextromethorphan, rest, and fluids. Follow up if not starting to improve within a week. Would consider azithromycin strongly at that time.   Pulse ox nml at 97%, HR not increased, no swelling to suggest DVT, no immobilization, no previous blood clot history, hemoptysis. Wells criteria negative. No further workup planned.

## 2013-01-02 NOTE — Progress Notes (Signed)
Subjective:   1. Cough/congestion-started 1-2 days after steroid injection on 12-30. Cough productive green sputum. Assc. Sore throat, not feeling like she can take a deep breath as she feels tight through chest and back. Feels like breathing is harder and mainly has to breath through mouth.  Symptoms essentially stable for last 2 weeks. Treatments-Has tried childrens inhalers albuterol which helped slightly. Some OTC tylenol/ibuprofen.   ROS--See HPI with additions-no vomiting/fever/chills, unintentional weight loss. Denies history prolonged immobilization, recent travel, leg swelling. No hemoptysis.   2. OA-patient with drastic improvement in knee pain after injection. Has gone to park, run around after her kids. Very happy with results.   Past Medical History-obesity, no history of asthma, nonsmoker Reviewed problem list.  Medications- reviewed and updated Chief complaint-noted  Objective: BP 104/69  Pulse 67  Temp 97 F (36.1 C)  Ht 5\' 5"  (1.651 m)  Wt 221 lb (100.245 kg)  BMI 36.78 kg/m2  LMP 10/25/2012 Gen: NAD, resting comfortably in chair, obese HEENT: NCAT, MMM, PERRLA. Edematous and erythetous nasal turbinates with rhinorrhea noted, TM normal in appearance, pharynx erythematous CV: RRR no mrg  Lungs: occasional wheeze noted otherwise clear to auscultation bilateral.  Abd: soft/nontender/nondistended/normal bowel sounds  MSK: moves all extremities, no edema  Skin: warm and dry, no rash   CXR-no infiltrate noted.   Assessment/Plan: See problem oriented charted

## 2013-01-02 NOTE — Patient Instructions (Signed)
I think you have Bronchitis. I hear a slight wheeze in your chest. I want you to get a chest x-ray to make sure you don't have a pneumonia though (I doubt it). I sent in some medicine for you to help with your cough and also an inhaler to help your breathing. Please come back in 5-7 days if not improving or before then if things get worse.   Bronquitis (Bronchitis) La bronquitis es [place] enfermedad de los conductos que transportan el aire a los pulmones. Esto dificulta la entrada y salida del aire. El Scientist, research (medical) de los conductos puede causar mucha tos. No recibir el tratamiento adecuado puede causar una bronquitis de larga duracin (crnica). CUIDADOS EN EL HOGAR  Tome mucha agua para mantener la orina clara o de color amarillo claro.  Use un humidificador de vapor fro.  Si fuma, deje de fumar. Si sigue fumando, impedir la curacin de la bronquitis.  Tome los medicamentos segn las indicaciones de su mdico. SOLICITE AYUDA DE INMEDIATO SI:  La tos lo despierta por las noches.  Respira con dificultad.  Usted o su nio se vuelve dbil o ms enfermo.  Presenta dificultades para respirar, tiene sibilancias o Company secretary.  Tose y escupe sangre.  La tos dura ms de 2 semanas.  Tiene fiebre.  Su beb tiene ms de 3 meses y su temperatura rectal es de 102 F (38.9 C) o ms.  Su beb tiene 3 meses o menos y su temperatura rectal es de 100.4 F (38 C) o ms. ASEGURESE DE:  Comprende estas instrucciones.  Controlar su enfermedad.  Solicitar ayuda de inmediato si no mejora o empeora. Document Released: 03/02/2009 Document Revised: 02/26/2012 [facility] Patient Information 2013 [place], [place].

## 2013-01-02 NOTE — Assessment & Plan Note (Signed)
Knee pain greatly improved with injection. Will follow.

## 2013-02-10 ENCOUNTER — Ambulatory Visit (INDEPENDENT_AMBULATORY_CARE_PROVIDER_SITE_OTHER): Payer: No Typology Code available for payment source | Admitting: Family Medicine

## 2013-02-10 ENCOUNTER — Encounter: Payer: Self-pay | Admitting: Family Medicine

## 2013-02-10 VITALS — BP 105/66 | HR 69 | Temp 98.3°F | Ht 65.0 in | Wt 227.0 lb

## 2013-02-10 DIAGNOSIS — E669 Obesity, unspecified: Secondary | ICD-10-CM

## 2013-02-10 DIAGNOSIS — R21 Rash and other nonspecific skin eruption: Secondary | ICD-10-CM

## 2013-02-10 DIAGNOSIS — K0889 Other specified disorders of teeth and supporting structures: Secondary | ICD-10-CM

## 2013-02-10 DIAGNOSIS — K089 Disorder of teeth and supporting structures, unspecified: Secondary | ICD-10-CM

## 2013-02-10 NOTE — Patient Instructions (Signed)
I am concerned that you may have a "fatty liver". We are doing some tests to evaluate this as well as other causes of your liver tests. We need to have some additional help with your weight loss efforts. Please see Dr. Gerilyn Pilgrim of Nutrition (call for appointment).   Thanks, Dr. Durene Cal.
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
## 2013-02-10 NOTE — Addendum Note (Signed)
Addended by: Shelva Majestic on: 02/10/2013 06:00 PM   Modules accepted: Orders

## 2013-02-10 NOTE — Assessment & Plan Note (Signed)
Referred to life coach multiple times previously. Will attempt referral to nutrition at this time. Encouraged patient to continue exercise. Needs to eat regular meals.

## 2013-02-10 NOTE — Assessment & Plan Note (Addendum)
Hepatitis B, C, iron studies for hemochromatosis ordered. RUQ ultrasound ordered. Suspect NASH. Advised need for weight loss. Referred to nutrition.

## 2013-02-11 LAB — HEPATITIS C ANTIBODY, REFLEX: HCV Ab: NEGATIVE

## 2013-02-11 LAB — HEPATITIS B SURFACE ANTIBODY,QUALITATIVE: Hep B S Ab: NONREACTIVE

## 2013-02-11 LAB — IRON AND TIBC: %SAT: 17 % — ABNORMAL LOW (ref 20–55)

## 2013-02-12 ENCOUNTER — Ambulatory Visit (HOSPITAL_COMMUNITY)
Admission: RE | Admit: 2013-02-12 | Discharge: 2013-02-12 | Disposition: A | Discharge status: Home / Self Care | Payer: No Typology Code available for payment source | Source: Ambulatory Visit | Attending: Family Medicine | Admitting: Family Medicine

## 2013-02-12 DIAGNOSIS — R7402 Elevation of levels of lactic acid dehydrogenase (LDH): Secondary | ICD-10-CM | POA: Insufficient documentation

## 2013-02-12 DIAGNOSIS — K7689 Other specified diseases of liver: Secondary | ICD-10-CM | POA: Insufficient documentation

## 2013-02-12 DIAGNOSIS — R7401 Elevation of levels of liver transaminase levels: Secondary | ICD-10-CM | POA: Insufficient documentation

## 2013-02-25 ENCOUNTER — Telehealth: Payer: Self-pay | Admitting: Family Medicine

## 2013-02-25 DIAGNOSIS — K7581 Nonalcoholic steatohepatitis (NASH): Secondary | ICD-10-CM

## 2013-02-25 NOTE — Telephone Encounter (Signed)
Precepted case with Dr. Sheffield Slider who recommended CT abd with contrast to further evaluate lymph node. I have ordered this.   Updated patient by phone interpreter that we needed this. Also alerted to need for vaccination against hep B. Other tests unremarkable. 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    Will ask nursing staff to schedule scan and call and update patient (she wants [place] to use [employer] interpreters and none of our in person interpreters).  Also, ask nursing staff to ensure patient in cue for dental referral as previously ordered.  Finally, when you speak with patient, make sure she gets in contact with Dr. Gerilyn Pilgrim as I have recommended thsi on multiple occasions and it is yet to occur.

## 2013-02-26 ENCOUNTER — Ambulatory Visit (INDEPENDENT_AMBULATORY_CARE_PROVIDER_SITE_OTHER): Payer: No Typology Code available for payment source | Admitting: *Deleted

## 2013-02-26 DIAGNOSIS — Z23 Encounter for immunization: Secondary | ICD-10-CM

## 2013-02-26 NOTE — Telephone Encounter (Signed)
Patient scheduled for CT 02/28/13 @ 9:00am at [facility].  Marines notified patient of appointment.  Ileana Ladd

## 2013-02-26 NOTE — Progress Notes (Signed)
Patient in for Hep B vaccine  #1.

## 2013-02-28 ENCOUNTER — Ambulatory Visit (HOSPITAL_COMMUNITY)
Admission: RE | Admit: 2013-02-28 | Discharge: 2013-02-28 | Disposition: A | Discharge status: Home / Self Care | Payer: No Typology Code available for payment source | Source: Ambulatory Visit | Attending: Family Medicine | Admitting: Family Medicine

## 2013-02-28 DIAGNOSIS — K7689 Other specified diseases of liver: Secondary | ICD-10-CM | POA: Insufficient documentation

## 2013-02-28 MED ORDER — IOHEXOL 300 MG/ML  SOLN
100.0000 mL | Freq: Once | INTRAMUSCULAR | Status: AC | PRN
  Administered 2013-02-28: 100 mL via INTRAVENOUS

## 2013-03-03 ENCOUNTER — Telehealth: Payer: Self-pay | Admitting: Family Medicine

## 2013-03-03 DIAGNOSIS — R599 Enlarged lymph nodes, unspecified: Secondary | ICD-10-CM

## 2013-03-03 NOTE — Telephone Encounter (Signed)
There are no GI in town that will take the orange card.  We can refer her to [facility] or [place] but she would be self pay.  She could go to [facility] and meet with their financial counselor and make payment arrangements.  Then there is the [facility] at [facility] but will she have transportation to go out of town?  Lupita Leash

## 2013-03-03 NOTE — Telephone Encounter (Signed)
Discussed results of recent CT scan with patient. LIkely lymph nodes related to NASH. Discussed options of biopsy with orange card (patient would likely have to pay out of pocket) vs repeat scan in 6 months. Patient opted for 6 months ultrasound.

## 2013-03-04 ENCOUNTER — Telehealth: Payer: Self-pay | Admitting: Family Medicine

## 2013-03-04 NOTE — Telephone Encounter (Signed)
Talked to pt to schedule medical nutr therapy (MNT) appt.  Pt said she could not schedule at this time, but would call back to do so.

## 2013-03-31 ENCOUNTER — Ambulatory Visit (INDEPENDENT_AMBULATORY_CARE_PROVIDER_SITE_OTHER): Payer: No Typology Code available for payment source | Admitting: *Deleted

## 2013-03-31 DIAGNOSIS — Z23 Encounter for immunization: Secondary | ICD-10-CM

## 2013-03-31 MED ORDER — HEPATITIS B VAC RECOMBINANT 20 MCG/ML IJ SUSP: 1.0000 mL | Freq: Once | INTRAMUSCULAR | Status: DC

## 2013-03-31 NOTE — Progress Notes (Signed)
Patient here for nurse visit for Hep B #2.  Gaylene Brooks, RN

## 2013-04-19 ENCOUNTER — Emergency Department (HOSPITAL_COMMUNITY)
Admission: EM | Admit: 2013-04-19 | Discharge: 2013-04-20 | Disposition: A | Discharge status: Home / Self Care | Payer: No Typology Code available for payment source | Attending: Emergency Medicine | Admitting: Emergency Medicine

## 2013-04-19 DIAGNOSIS — E669 Obesity, unspecified: Secondary | ICD-10-CM | POA: Insufficient documentation

## 2013-04-19 DIAGNOSIS — N764 Abscess of vulva: Secondary | ICD-10-CM | POA: Insufficient documentation

## 2013-04-19 DIAGNOSIS — J029 Acute pharyngitis, unspecified: Secondary | ICD-10-CM | POA: Insufficient documentation

## 2013-04-19 DIAGNOSIS — Z792 Long term (current) use of antibiotics: Secondary | ICD-10-CM | POA: Insufficient documentation

## 2013-04-19 DIAGNOSIS — L0291 Cutaneous abscess, unspecified: Secondary | ICD-10-CM

## 2013-04-19 MED ORDER — LIDOCAINE HCL 2 % EX GEL
Freq: Once | CUTANEOUS | Status: AC
  Administered 2013-04-19: 20 via TOPICAL
  Filled 2013-04-19: qty 20

## 2013-04-19 MED ORDER — OXYCODONE-ACETAMINOPHEN 5-325 MG PO TABS
1.0000 | ORAL_TABLET | Freq: Once | ORAL | Status: AC
  Administered 2013-04-19: 1 via ORAL
  Filled 2013-04-19: qty 1

## 2013-04-19 NOTE — ED Notes (Signed)
abscess in vaginal area and right inguinal region

## 2013-04-19 NOTE — ED Provider Notes (Signed)
History     CSN: [id number]  Arrival date & time 04/19/13  2303   First MD Initiated Contact with Patient 04/19/13 2311      Chief Complaint  Patient presents with  . Abscess    (Consider location/radiation/quality/duration/timing/severity/associated sxs/prior treatment) HPI Julie [name] is a 32 y.o. female w hx of obesity presents to ER c/o right labial abscess. Onset started 1 week ago and has gradually been worsening. No knwn alleviating factors. Pain exacerbated by palpation.  Pain is severe and interferes w ability to walk. Pt denies F, NS, chills, drainage from abscess, vaginal dc, or dysuria.   Past Medical History  Diagnosis Date  . Obesity 01/22/2012  . History of cesarean section     [id number]. Last 2 by c-section.     Past Surgical History  Procedure Laterality Date  . Cesarean section  2006, 2010    x 2    Family History  Problem Relation Age of Onset  . Hyperlipidemia Father   . Diabetes type II Maternal Grandfather   . Anesthesia problems Neg Hx     History  Substance Use Topics  . Smoking status: Never Smoker   . Smokeless tobacco: Never Used  . Alcohol Use: No    OB History   Grav Para Term Preterm Abortions TAB SAB Ect Mult Living   4 3 3  0 1 0 1 0 0 3      Review of Systems  All other systems reviewed and are negative.    Allergies  Review of patient's allergies indicates no known allergies.  Home Medications   Current Outpatient Rx  Name  Route  Sig  Dispense  Refill  . amoxicillin-clavulanate (AUGMENTIN) 875-125 MG per tablet   Oral   Take 1 tablet by mouth 2 (two) times daily. Started 5.2.14 for 7 days         . clindamycin (CLEOCIN) 300 MG capsule   Oral   Take 300 mg by mouth 4 (four) times daily. For 7 days starting 5.2.14           BP 137/89  Pulse 113  Temp(Src) 98.3 F (36.8 C) (Oral)  Resp 16  SpO2 98%  Physical Exam  Nursing note and vitals reviewed. Constitutional: She is oriented to person, place,  and time. She appears well-developed and well-nourished. She does not have a sickly appearance. She does not appear ill. No distress.  HENT:  Head: Normocephalic and atraumatic.  Mouth/Throat: Oropharyngeal exudate present.  Eyes: Conjunctivae and EOM are normal.  Neck: Normal range of motion. Neck supple.  Cardiovascular: Normal rate and regular rhythm.   Pulmonary/Chest: Effort normal and breath sounds normal.  Genitourinary:     Musculoskeletal: She exhibits no edema.  Lymphadenopathy:       Head (right side): No submental, no preauricular and no posterior auricular adenopathy present.       Head (left side): No submental, no submandibular, no preauricular and no posterior auricular adenopathy present.    She has no axillary adenopathy.  Neurological: She is alert and oriented to person, place, and time.  Skin: Skin is warm and dry. No rash noted. She is not diaphoretic.  2cm sized abscess located on external genitalia. Extreme tenderness to palpation. Not currently draining. Abscess is fluctuant without warmth, surrounding erythema, or induration.    ED Course  Procedures (including critical care time)  Labs Reviewed - No data to display No results found.  INCISION AND DRAINAGE Performed by: Jaci Carrel Consent: Verbal  consent obtained. Risks and benefits: risks, benefits and alternatives were discussed Type: abscess  Body area: right superior labial fold   Anesthesia: local infiltration  Incision was made with a scalpel.  Local anesthetic: lidocaine jelly & lidocaine 2% with out epinephrine  Anesthetic total: .5 ml  Complexity: complex Blunt dissection to break up loculations not tolerated  Drainage: purulent & bloody  Drainage amount: scant   Patient tolerance: Patient did not tolerate procedure d/t pain.   No diagnosis found.  Consult OBGYN- continue antibiotics.  Start warm compresses.  Followup at [facility] tomorrow or women's clinic if pain is managed with  oral pain medication  MDM  32 year old female presenting to emergency department with abscess in right labial fold.  Onset began about one week ago.  Patient was evaluated by [facility] and placed on Augmentin and clindamycin.  I and D. attempted, however unsuccessful do to significant pain.  OB/GYN consulted and advised to continue antibiotics, start warm compresses and followup with women's clinic on Monday.  If the pain is intolerable tomorrow patient has been advised to go to [facility].  Plan discussed with patient via translator.  Patient is agreeable with disposition.  Attending has seen and evaluated patient as well and is agreeable to treatment plan.        Jaci Carrel, [place] 04/20/13 0126

## 2013-04-20 MED ORDER — HYDROMORPHONE HCL PF 2 MG/ML IJ SOLN
2.0000 mg | INTRAMUSCULAR | Status: AC
  Administered 2013-04-20: 2 mg via INTRAMUSCULAR

## 2013-04-20 MED ORDER — PERCOCET 5-325 MG PO TABS: 1.0000 | ORAL_TABLET | Freq: Four times a day (QID) | ORAL | Status: DC | PRN

## 2013-04-20 MED ORDER — HYDROMORPHONE HCL PF 2 MG/ML IJ SOLN
INTRAMUSCULAR | Status: AC
  Filled 2013-04-20: qty 1

## 2013-04-20 NOTE — ED Notes (Signed)
I/d attempted by lisette paz.  Pt unable to tolerate procedure.  Verbal order for dilaudid obtained.

## 2013-04-20 NOTE — ED Notes (Signed)
Pt dc to home.  Pt sts understanding to dc instructions.  Pt denies need for w/c.  Pt ambulatory to exit without difficulty

## 2013-04-20 NOTE — ED Provider Notes (Signed)
Medical screening examination/treatment/procedure(s) were conducted as a shared visit with non-physician practitioner(s) and myself.  I personally evaluated the patient during the encounter  Doug Sou, MD 04/20/13 [phone]

## 2013-04-20 NOTE — ED Provider Notes (Signed)
Pt with 1 cm flucuant area at right labia minora. Pt aelrt nontoxic appearing  Doug Sou, MD 04/20/13 [phone]

## 2013-05-02 ENCOUNTER — Encounter: Payer: No Typology Code available for payment source | Admitting: Obstetrics & Gynecology

## 2013-05-15 ENCOUNTER — Encounter: Payer: Self-pay | Admitting: Obstetrics & Gynecology

## 2013-05-17 ENCOUNTER — Encounter: Payer: Self-pay | Admitting: Family Medicine

## 2013-05-26 ENCOUNTER — Ambulatory Visit (INDEPENDENT_AMBULATORY_CARE_PROVIDER_SITE_OTHER): Payer: No Typology Code available for payment source | Admitting: Family Medicine

## 2013-05-26 ENCOUNTER — Encounter: Payer: Self-pay | Admitting: Family Medicine

## 2013-05-26 VITALS — BP 92/50 | HR 87 | Temp 98.5°F | Wt 220.0 lb

## 2013-05-26 DIAGNOSIS — Z0100 Encounter for examination of eyes and vision without abnormal findings: Secondary | ICD-10-CM

## 2013-05-26 DIAGNOSIS — H547 Unspecified visual loss: Secondary | ICD-10-CM

## 2013-05-26 NOTE — Assessment & Plan Note (Signed)
Will refer to Eye doctor.  However, pt's orange card will expire this week, she will have to re-apply before we can get her on the eye doctor referral list, discussed they see a limited number of patients per day.  Advised orange card will not cover cost of glasses, but advised it may be less expensive to order them online.  She voices understanding.  I have asked her to call and leave a message to remind [place] to put in the referral once her orange card is re-approved.

## 2013-05-26 NOTE — Progress Notes (Signed)
  Subjective:    Patient ID: Julie [name], female    DOB: 10/18/1981, 32 y.o.   MRN: [id number]  Headache     Julie [name] comes in complaining of blurry vision and headaches.  She says she used to wear glasses but lost them.  She has been having headaches, nearly every day, and feels that it is because she is straining to see.  She says when she closes her eyes and relaxes it seems to improve.  She denies spots in her visual fields.  She has not taken anything for this because she felt like it was from her vision.    Review of Systems  Neurological: Positive for headaches.   See HPI    Objective:   Physical Exam  Vitals reviewed. Constitutional: She is oriented to person, place, and time. She appears well-developed and well-nourished. No distress.  Eyes: Conjunctivae and EOM are normal. Pupils are equal, round, and reactive to light.  Fundoscopic exam:      The right eye shows no papilledema.       The left eye shows no papilledema.  Neurological: She is alert and oriented to person, place, and time. No cranial nerve deficit.   Visual Acuity: 20/50 in R, 20/60 in Left       Assessment & Plan:

## 2013-06-16 ENCOUNTER — Encounter: Payer: Self-pay | Admitting: Obstetrics & Gynecology

## 2013-06-16 ENCOUNTER — Ambulatory Visit (INDEPENDENT_AMBULATORY_CARE_PROVIDER_SITE_OTHER): Payer: Self-pay | Admitting: Obstetrics & Gynecology

## 2013-06-16 VITALS — BP 109/76 | HR 65 | Temp 98.9°F | Wt 222.4 lb

## 2013-06-16 DIAGNOSIS — B3731 Acute candidiasis of vulva and vagina: Secondary | ICD-10-CM

## 2013-06-16 DIAGNOSIS — N751 Abscess of Bartholin's gland: Secondary | ICD-10-CM

## 2013-06-16 DIAGNOSIS — G44209 Tension-type headache, unspecified, not intractable: Secondary | ICD-10-CM

## 2013-06-16 DIAGNOSIS — B373 Candidiasis of vulva and vagina: Secondary | ICD-10-CM

## 2013-06-16 HISTORY — DX: Abscess of Bartholin's gland: N75.1

## 2013-06-16 MED ORDER — FLUCONAZOLE 150 MG PO TABS: 150.0000 mg | ORAL_TABLET | Freq: Once | ORAL | Status: DC

## 2013-06-16 MED ORDER — BUTALBITAL-APAP-CAFFEINE 50-500-40 MG PO TABS: 1.0000 | ORAL_TABLET | ORAL | Status: DC | PRN

## 2013-06-16 NOTE — Patient Instructions (Signed)
Absceso y quiste de [place] (Bartholin's Cyst and Abscess) Estas glndulas producen una secrecin a travs de pequeos orificios en la zona externa de la abertura de la vagina. El moco lubrica la vagina durante las relaciones sexuales. Si los conductos se obstruyen, la glndula se hinchar y formar un bulto en el interior de la vagina. Si se vuelve lo suficientemente grande, podr verse y sentirse tambin hacia afuera de la vagina. En algunos casos la inflamacin desaparece sin necesidad de tratamiento. Sin embargo, si se infecta, el quiste de Bartolino se llena de pus y se inflamado, duele y se trasforma en un absceso. En este caso habr que realizar un drenaje con una aguja o quirrgico. En algunos casos es necesario practicar una ciruga menor con anestesia local y se coloca un pequeo tubo en la pared del absceso. El tratamiento debe continuar por 6 semanas. Tambin podr practicarse una ciruga menor para reemplazar el conducto obstruido y Automotive engineer otros abscesos en el futuro. Si el absceso se produce varias veces, ser necesario practicar una operacin menor para extirpar completamente la glndula de Bartolino o hacer un mejor drenaje. Podr ser necesario que un mdico obstetra o gineclogo corte la glndula y suture los bordes para hacer ms grande la abertura ([place]). En algunos casos se prescriben antibiticos. Tome todos los antibiticos tal como se le indic. Termine con la cantidad prescripta aunque se sienta mejor. Tome baos de asiento tibios durante 20 minutos, 3 veces por Futures trader. Comunquese con el profesional que lo asiste para Education officer, environmental un seguimiento segn las indicaciones. SOLICITE ATENCIN MDICA SI:  Presenta enrojecimiento, hinchazn o aumento del dolor en la zona de la vagina.  Tiene vmitos y no Psychologist, educational.  Tiene fiebre.  Presenta una hemorragia vaginal que no puede controlar. Document Released: 12/04/2005 Document Revised: 02/26/2012 [facility]  Patient Information 2014 [place], [place].

## 2013-06-16 NOTE — Progress Notes (Signed)
Patient ID: Julie [name], female   DOB: 20-Apr-1981, 32 y.o.   MRN: [id number]  Chief Complaint  Patient presents with  . Bartholin's Cyst    was supposed to followup in May  . Vaginal Itching    with discharge    HPI Julie [name] is a 32 y.o. female.  [id number] Patient's last menstrual period was 06/02/2013. Had I&D Bartholin's abscess in May. Now frequent posterior and frontal headache, no aura. Vaginal discharge and itch.  HPI  Past Medical History  Diagnosis Date  . Obesity 01/22/2012  . History of cesarean section     [id number]. Last 2 by c-section.     Past Surgical History  Procedure Laterality Date  . Cesarean section  2006, 2010    x 2    Family History  Problem Relation Age of Onset  . Hyperlipidemia Father   . Diabetes type II Maternal Grandfather   . Anesthesia problems Neg Hx     Social History History  Substance Use Topics  . Smoking status: Never Smoker   . Smokeless tobacco: Never Used  . Alcohol Use: No    No Known Allergies  Current Outpatient Prescriptions  Medication Sig Dispense Refill  . amoxicillin-clavulanate (AUGMENTIN) 875-125 MG per tablet Take 1 tablet by mouth 2 (two) times daily. Started 5.2.14 for 7 days      . butalbital-acetaminophen-caffeine (ESGIC PLUS) 50-500-40 MG per tablet Take 1-2 tablets by mouth every 4 (four) hours as needed for pain or headache.  20 tablet  0  . clindamycin (CLEOCIN) 300 MG capsule Take 300 mg by mouth 4 (four) times daily. For 7 days starting 5.2.14      . fluconazole (DIFLUCAN) 150 MG tablet Take 1 tablet (150 mg total) by mouth once.  1 tablet  0  . PERCOCET 5-325 MG per tablet Take 1 tablet by mouth every 6 (six) hours as needed for pain.  30 tablet  0   No current facility-administered medications for this visit.    Review of Systems Review of Systems  Constitutional: Negative for fever.  Genitourinary: Positive for vaginal discharge. Negative for vaginal bleeding, vaginal pain and  menstrual problem.  Neurological: Positive for headaches.    Blood pressure 109/76, pulse 65, temperature 98.9 F (37.2 C), temperature source Oral, weight 222 lb 6.4 oz (100.88 kg), last menstrual period 06/02/2013.  Physical Exam Physical Exam  Constitutional: She is oriented to person, place, and time. She appears well-developed and well-nourished. No distress.  HENT:  Head: Normocephalic and atraumatic.  Not tender  Neck: Normal range of motion.  Pulmonary/Chest: Effort normal. No respiratory distress.  Genitourinary: Uterus normal. Vaginal discharge found.  Discharge c/w yeast  Neurological: She is alert and oriented to person, place, and time.  Skin: Skin is warm and dry.  Psychiatric: She has a normal mood and affect. Her behavior is normal.    Data Reviewed ED visit  Assessment    S/P Bartholin's abscess  Tension headache     Plan    Diflucan X 1     Current Outpatient Prescriptions  Medication Sig Dispense Refill  .        . butalbital-acetaminophen-caffeine (ESGIC PLUS) 50-500-40 MG per tablet Take 1-2 tablets by mouth every 4 (four) hours as needed for pain or headache.  20 tablet  0  .        . fluconazole (DIFLUCAN) 150 MG tablet Take 1 tablet (150 mg total) by mouth once.  1 tablet  0  .  No current facility-administered medications for this visit.      Genesi Stefanko 06/16/2013, 2:47 PM

## 2013-06-17 LAB — WET PREP, GENITAL
Clue Cells Wet Prep HPF POC: NONE SEEN
Trich, Wet Prep: NONE SEEN
Yeast Wet Prep HPF POC: NONE SEEN

## 2013-07-14 ENCOUNTER — Ambulatory Visit: Payer: Self-pay | Admitting: Family Medicine

## 2013-08-27 ENCOUNTER — Ambulatory Visit (INDEPENDENT_AMBULATORY_CARE_PROVIDER_SITE_OTHER): Payer: No Typology Code available for payment source | Admitting: Family Medicine

## 2013-08-27 ENCOUNTER — Encounter: Payer: Self-pay | Admitting: Family Medicine

## 2013-08-27 VITALS — BP 101/64 | HR 76 | Temp 98.5°F | Wt 220.0 lb

## 2013-08-27 DIAGNOSIS — M1990 Unspecified osteoarthritis, unspecified site: Secondary | ICD-10-CM

## 2013-08-27 DIAGNOSIS — M25569 Pain in unspecified knee: Secondary | ICD-10-CM

## 2013-08-27 DIAGNOSIS — M25561 Pain in right knee: Secondary | ICD-10-CM

## 2013-08-27 MED ORDER — DICLOFENAC SODIUM 75 MG PO TBEC: 75.0000 mg | DELAYED_RELEASE_TABLET | Freq: Two times a day (BID) | ORAL | Status: DC

## 2013-08-27 NOTE — Patient Instructions (Signed)
It was nice to meet you. Take the Diclofenac twice daily as needed. The sports medicine nurse will call you with your appointment.  Follow up with Dr. Durene Cal as needed.  Julie [name], M.D.  Knee Exercises EXERCISES RANGE OF MOTION(ROM) AND STRETCHING EXERCISES These exercises may help you when beginning to rehabilitate your injury. Your symptoms may resolve with or without further involvement from your physician, physical therapist or athletic trainer. While completing these exercises, remember:   Restoring tissue flexibility helps normal motion to return to the joints. This allows healthier, less painful movement and activity.  An effective stretch should be held for at least 30 seconds.  A stretch should never be painful. You should only feel a gentle lengthening or release in the stretched tissue. STRETCH - Knee Extension, Prone  Lie on your stomach on a firm surface, such as a bed or countertop. Place your right / left knee and leg just beyond the edge of the surface. You may wish to place a towel under the far end of your right / left thigh for comfort.  Relax your leg muscles and allow gravity to straighten your knee. Your clinician may advise you to add an ankle weight if more resistance is helpful for you.  You should feel a stretch in the back of your right / left knee. Hold this position for __________ seconds. Repeat __________ times. Complete this stretch __________ times per day. * Your physician, physical therapist or athletic trainer may ask you to add ankle weight to enhance your stretch.  RANGE OF MOTION - Knee Flexion, Active  Lie on your back with both knees straight. (If this causes back discomfort, bend your opposite knee, placing your foot flat on the floor.)  Slowly slide your heel back toward your buttocks until you feel a gentle stretch in the front of your knee or thigh.  Hold for __________ seconds. Slowly slide your heel back to the starting  position. Repeat __________ times. Complete this exercise __________ times per day.  STRETCH - Quadriceps, Prone   Lie on your stomach on a firm surface, such as a bed or padded floor.  Bend your right / left knee and grasp your ankle. If you are unable to reach, your ankle or pant leg, use a belt around your foot to lengthen your reach.  Gently pull your heel toward your buttocks. Your knee should not slide out to the side. You should feel a stretch in the front of your thigh and/or knee.  Hold this position for __________ seconds. Repeat __________ times. Complete this stretch __________ times per day.  STRETCH  Hamstrings, Supine   Lie on your back. Loop a belt or towel over the ball of your right / left foot.  Straighten your right / left knee and slowly pull on the belt to raise your leg. Do not allow the right / left knee to bend. Keep your opposite leg flat on the floor.  Raise the leg until you feel a gentle stretch behind your right / left knee or thigh. Hold this position for __________ seconds. Repeat __________ times. Complete this stretch __________ times per day.  STRENGTHENING EXERCISES These exercises may help you when beginning to rehabilitate your injury. They may resolve your symptoms with or without further involvement from your physician, physical therapist or athletic trainer. While completing these exercises, remember:   Muscles can gain both the endurance and the strength needed for everyday activities through controlled exercises.  Complete these exercises  as instructed by your physician, physical therapist or athletic trainer. Progress the resistance and repetitions only as guided.  You may experience muscle soreness or fatigue, but the pain or discomfort you are trying to eliminate should never worsen during these exercises. If this pain does worsen, stop and make certain you are following the directions exactly. If the pain is still present after adjustments,  discontinue the exercise until you can discuss the trouble with your clinician. STRENGTH - Quadriceps, Isometrics  Lie on your back with your right / left leg extended and your opposite knee bent.  Gradually tense the muscles in the front of your right / left thigh. You should see either your knee cap slide up toward your hip or increased dimpling just above the knee. This motion will push the back of the knee down toward the floor/mat/bed on which you are lying.  Hold the muscle as tight as you can without increasing your pain for __________ seconds.  Relax the muscles slowly and completely in between each repetition. Repeat __________ times. Complete this exercise __________ times per day.  STRENGTH - Quadriceps, Short Arcs   Lie on your back. Place a __________ inch towel roll under your knee so that the knee slightly bends.  Raise only your lower leg by tightening the muscles in the front of your thigh. Do not allow your thigh to rise.  Hold this position for __________ seconds. Repeat __________ times. Complete this exercise __________ times per day.  OPTIONAL ANKLE WEIGHTS: Begin with ____________________, but DO NOT exceed ____________________. Increase in 1 pound/0.5 kilogram increments.  STRENGTH - Quadriceps, Straight Leg Raises  Quality counts! Watch for signs that the quadriceps muscle is working to insure you are strengthening the correct muscles and not "cheating" by substituting with healthier muscles.  Lay on your back with your right / left leg extended and your opposite knee bent.  Tense the muscles in the front of your right / left thigh. You should see either your knee cap slide up or increased dimpling just above the knee. Your thigh may even quiver.  Tighten these muscles even more and raise your leg 4 to 6 inches off the floor. Hold for __________ seconds.  Keeping these muscles tense, lower your leg.  Relax the muscles slowly and completely in between each  repetition. Repeat __________ times. Complete this exercise __________ times per day.  STRENGTH - Hamstring, Curls  Lay on your stomach with your legs extended. (If you lay on a bed, your feet may hang over the edge.)  Tighten the muscles in the back of your thigh to bend your right / left knee up to 90 degrees. Keep your hips flat on the bed/floor.  Hold this position for __________ seconds.  Slowly lower your leg back to the starting position. Repeat __________ times. Complete this exercise __________ times per day.  OPTIONAL ANKLE WEIGHTS: Begin with ____________________, but DO NOT exceed ____________________. Increase in 1 pound/0.5 kilogram increments.  STRENGTH  Quadriceps, Squats  Stand in a door frame so that your feet and knees are in line with the frame.  Use your hands for balance, not support, on the frame.  Slowly lower your weight, bending at the hips and knees. Keep your lower legs upright so that they are parallel with the door frame. Squat only within the range that does not increase your knee pain. Never let your hips drop below your knees.  Slowly return upright, pushing with your legs, not pulling with your  hands. Repeat __________ times. Complete this exercise __________ times per day.  STRENGTH - Quadriceps, Wall Slides  Follow guidelines for form closely. Increased knee pain often results from poorly placed feet or knees.  Lean against a smooth wall or door and walk your feet out 18-24 inches. Place your feet hip-width apart.  Slowly slide down the wall or door until your knees bend __________ degrees.* Keep your knees over your heels, not your toes, and in line with your hips, not falling to either side.  Hold for __________ seconds. Stand up to rest for __________ seconds in between each repetition. Repeat __________ times. Complete this exercise __________ times per day. * Your physician, physical therapist or athletic trainer will alter this angle based on  your symptoms and progress. Document Released: 10/18/2005 Document Revised: 02/26/2012 Document Reviewed: 03/18/2009 Julie [name] Patient Information 2014 [place], [place].
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
## 2013-08-28 NOTE — Assessment & Plan Note (Signed)
Known history. Continue ice, exercise and NSAID. Will refer to [facility] for ultrasound, but likely this is just arthritis. Patient agrees with plan. Will defer discussing weight loss to PCP.

## 2013-09-01 ENCOUNTER — Ambulatory Visit: Payer: No Typology Code available for payment source | Admitting: *Deleted

## 2013-09-01 DIAGNOSIS — Z23 Encounter for immunization: Secondary | ICD-10-CM

## 2013-09-01 NOTE — Progress Notes (Unsigned)
Patient here today for nurse visit to receive Hep B #3.  Gaylene Brooks, RN

## 2013-09-08 ENCOUNTER — Ambulatory Visit (INDEPENDENT_AMBULATORY_CARE_PROVIDER_SITE_OTHER): Payer: No Typology Code available for payment source | Admitting: Family Medicine

## 2013-09-08 VITALS — BP 111/76 | Ht 65.0 in | Wt 220.0 lb

## 2013-09-08 DIAGNOSIS — M25561 Pain in right knee: Secondary | ICD-10-CM

## 2013-09-08 DIAGNOSIS — M25569 Pain in unspecified knee: Secondary | ICD-10-CM

## 2013-09-08 NOTE — Patient Instructions (Addendum)
You have been scheduled for a MRI of your right knee on 09/13/13 at 6:45 PM  [facility] [address]  [phone]  Please schedule a follow up with Dr. Jennette Kettle 1 week after your MRI appt

## 2013-09-09 DIAGNOSIS — M25569 Pain in unspecified knee: Secondary | ICD-10-CM | POA: Insufficient documentation

## 2013-09-09 NOTE — Progress Notes (Signed)
  Subjective:    Patient ID: Julie [name], female    DOB: Jul 04, 1981, 32 y.o.   MRN: [id number]  HPI  Right knee pain worsening over the last 6-9 months. 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 Feels like the knee locks. She also has difficulty kneeling and is essentially unable to do that part of her job. No specific injury that she is aware of. Has had some bilateral knee pain for a while but the right knee has been specifically bothering her for the last year. It occasionally swells. It feels stiff. Pain at its worst as 9/10 area chronic pain is 1-2/10.  Review of Systems Denies fever, sweats, chills.    Objective:   Physical Exam  Vital signs are reviewed GENERAL: Well-developed overweight female no acute distress KNEE: Right. Full extension but pain and some stiffness with the last 5 of extension. Full flexion. Ligamentously intact to varus and valgus stress. Lockman is normal. McMurray is positive. Calf is soft. ; Medial and lateral menisci are not well seen and or not distinct. There is a small amount of fluid in the suprapatellar pouch. The quadriceps and patellar tendon are normal. There is a small osteophyte at the insertion of the quadriceps tendon.      Assessment & Plan:  Knee pain with ultrasound consistent with some meniscal issues in her clinical picture consistent with that. Her x-rays show minimal medial joint space loss so I think we are looking at her meniscal issue and we need to get an MRI which I have set up. I will see her after that. Today's interview was repleted with the use of an interpreter.

## 2013-09-13 ENCOUNTER — Other Ambulatory Visit: Payer: No Typology Code available for payment source

## 2013-09-15 ENCOUNTER — Other Ambulatory Visit: Payer: Self-pay | Admitting: Family Medicine

## 2013-09-19 ENCOUNTER — Ambulatory Visit: Payer: No Typology Code available for payment source | Admitting: Family Medicine

## 2013-09-24 IMAGING — CT CT ABD-PELV W/ CM
2 of 4 series · 17 of 46 positions shown, 19 images · IV contrast (CONTRAST)
Comparison: 02/12/2013 abdominal ultrasound at [HOSPITAL] [HOSPITAL]

CLINICAL DATA: Non alcoholic steatohepatitis.  [place]
lymphadenopathy on prior ultrasound

CT ABDOMEN AND PELVIS WITH CONTRAST
TECHNIQUE: Multidetector CT imaging of the abdomen and pelvis was
performed following the standard protocol during bolus
administration of intravenous contrast.
Contrast: 100mL OMNIPAQUE IOHEXOL 300 MG/ML  SOLN

[Series 2: routine · axial · 0.74mm/px · z∈[+642,+1078]mm · 14 of 95 slices shown, 16 images]
[im 4/95  soft-tissue]
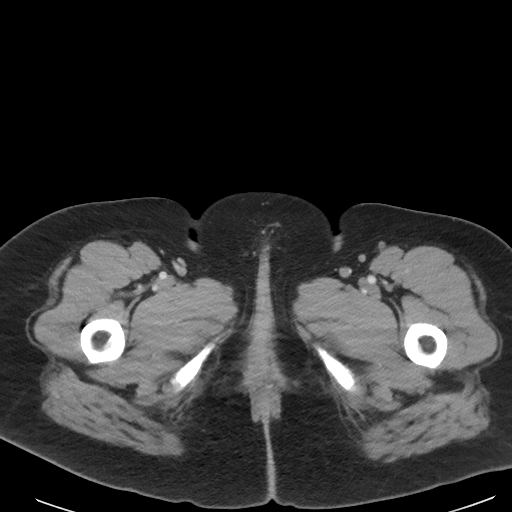
[im 4/95  bone]
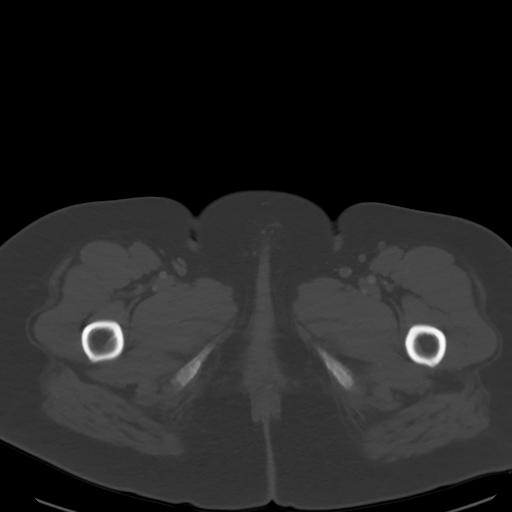
[im 12/95  soft-tissue]
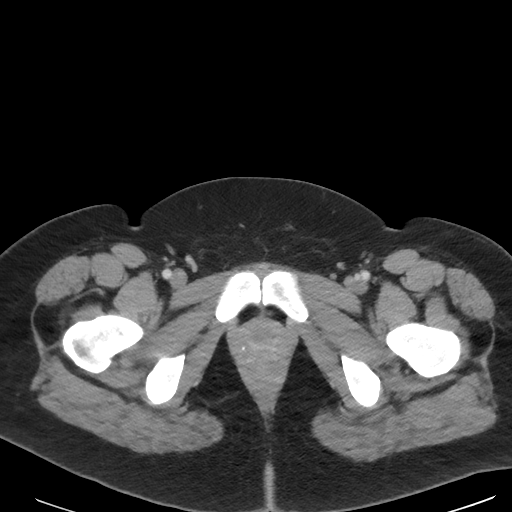
[im 20/95  soft-tissue]
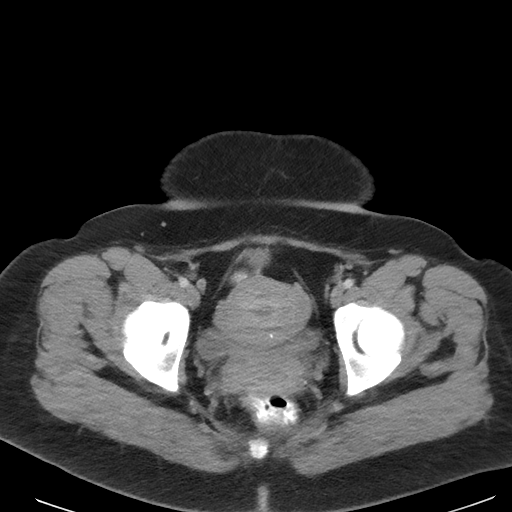
[im 24/95  soft-tissue]
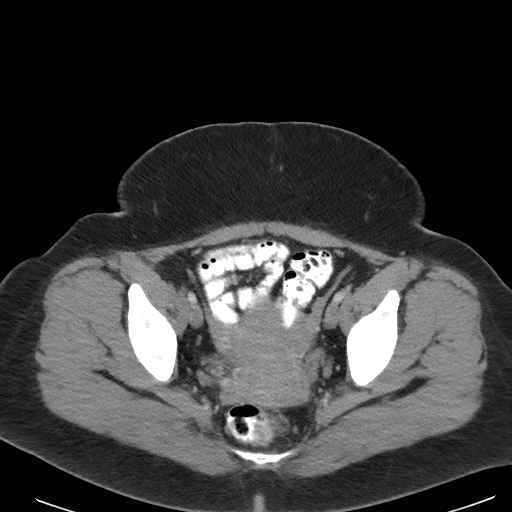
[im 32/95  soft-tissue]
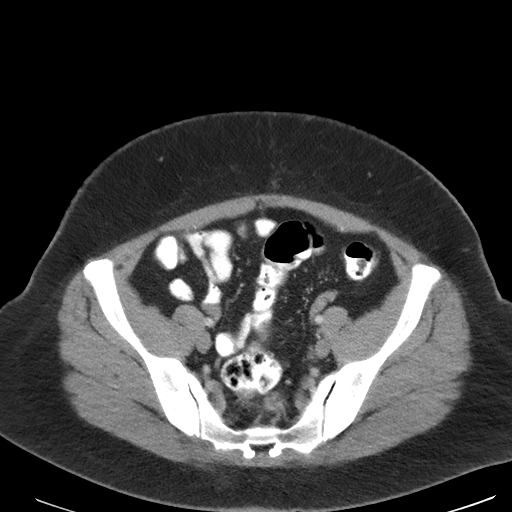
[im 40/95  soft-tissue]
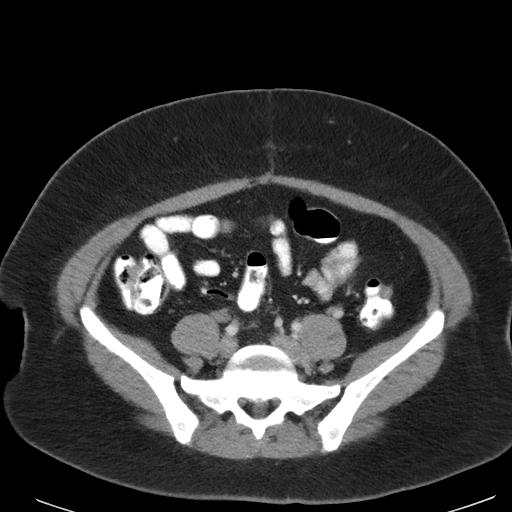
[im 44/95  soft-tissue]
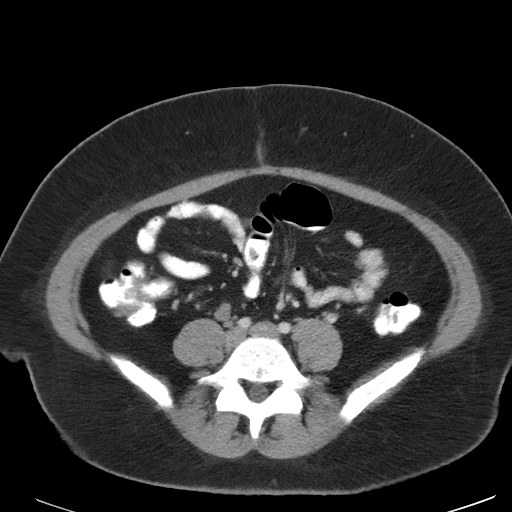
[im 51/95  soft-tissue]
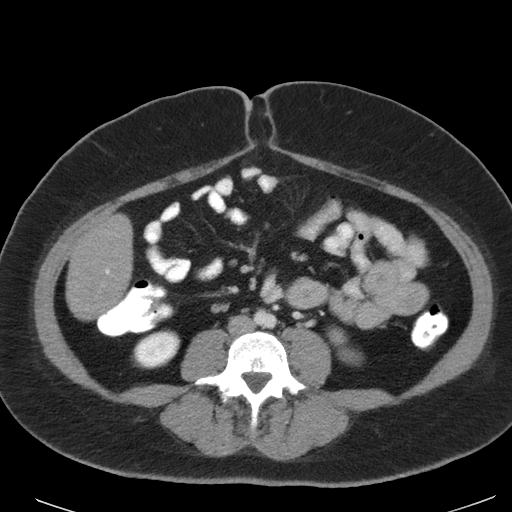
[im 55/95  soft-tissue]
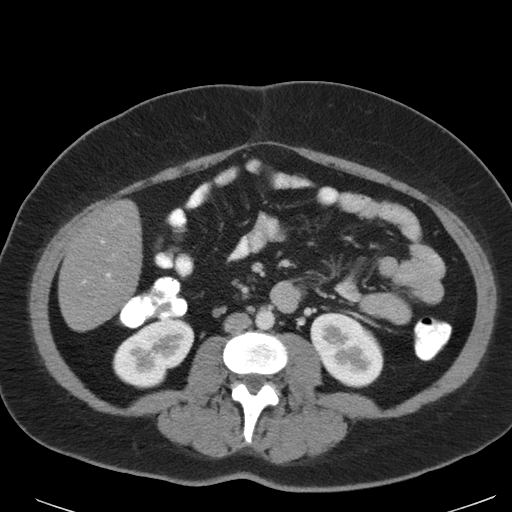
[im 55/95  bone]
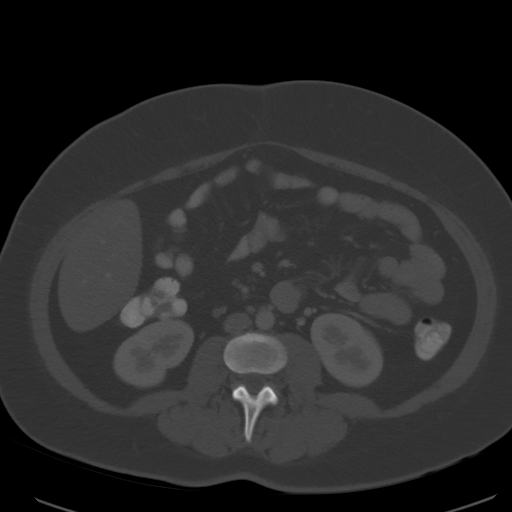
[im 63/95  soft-tissue]
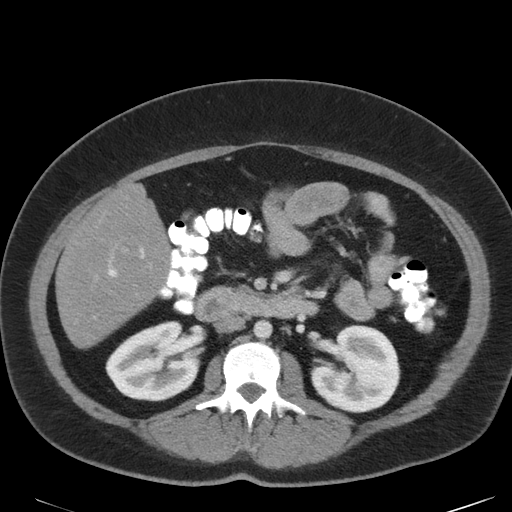
[im 71/95  soft-tissue]
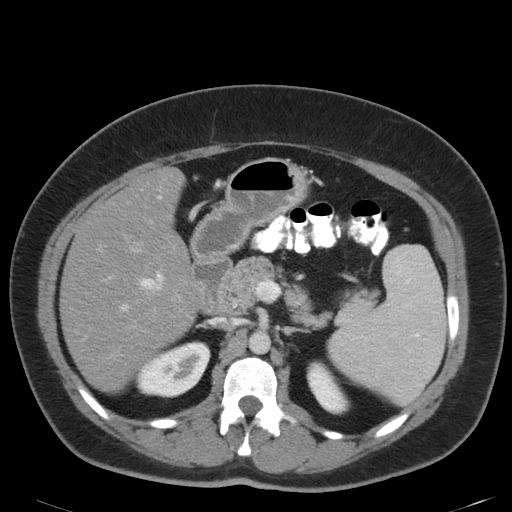
[im 75/95  soft-tissue]
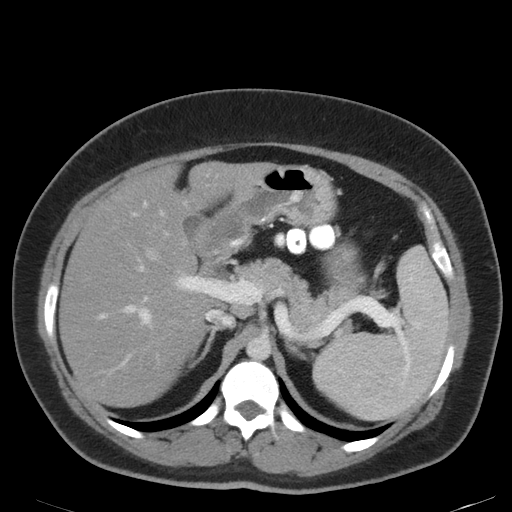
[im 83/95  soft-tissue]
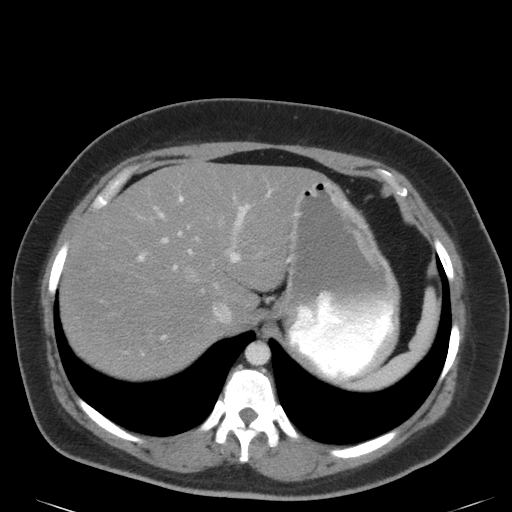
[im 91/95  soft-tissue]
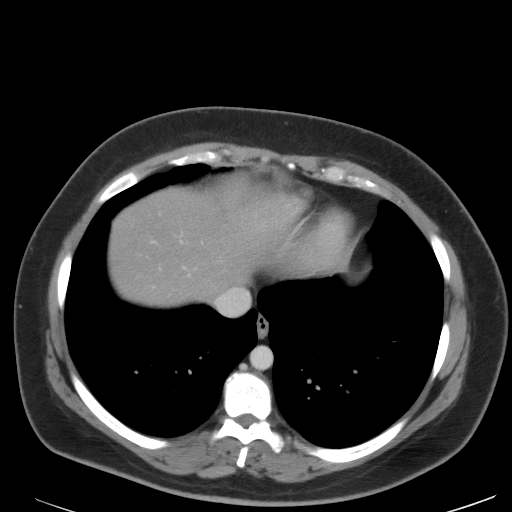

[mpr, coronals, coronal · coronal · 0.92mm/px · 3 of 102 slices shown]
[im 34/102  soft-tissue]
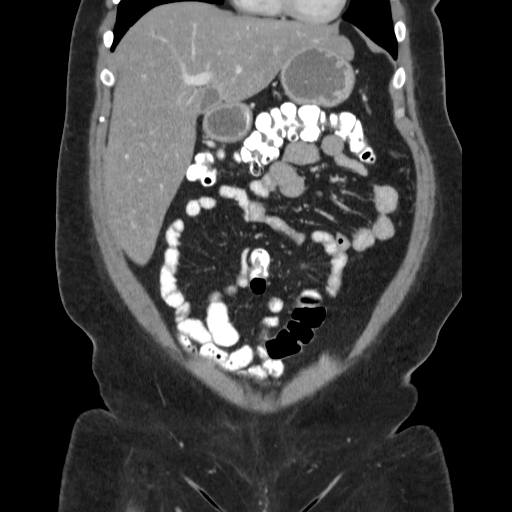
[im 45/102  soft-tissue]
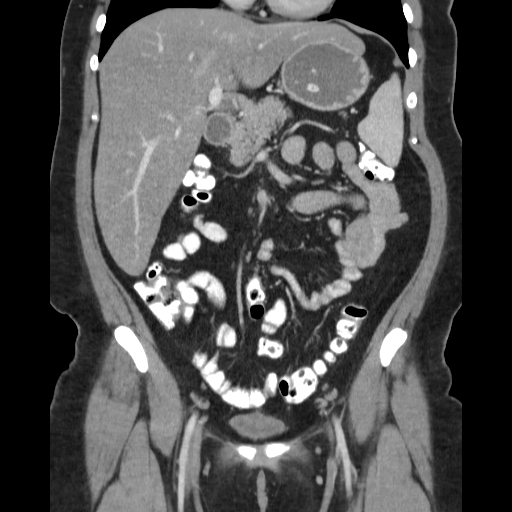
[im 57/102  soft-tissue]
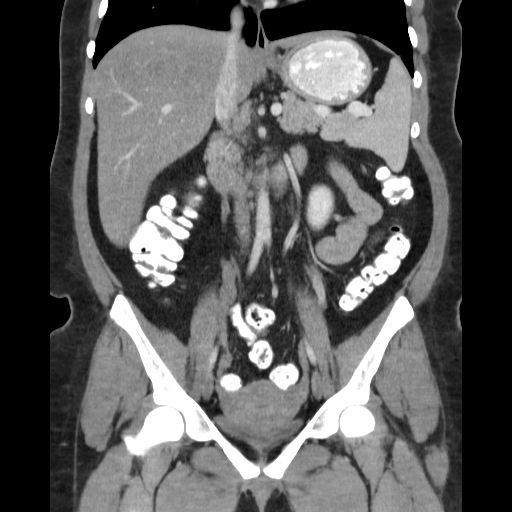

[17 of 46 positions shown; findings below may reference images not displayed]

FINDINGS: 2 ml subpleural left lower lobe nodules image 11 are
unlikely to be clinically significant.  Lung bases are otherwise
clear.

Diffuse hepatic hypodensity compatible with hepatic steatosis.
Adrenal glands, kidneys, spleen, pancreas, and gallbladder are
unremarkable.  Abutting the superior aspect of the duodenal bulb
and immediately anterior to the IVC, is a 2.4 x 2.1 x 1.7 cm mass.
This corresponds to that questioned at the previous exam.  A
similar appearing 1.8 x 1.1 cm soft tissue mass is noted adjacent
to this in the porta hepatis, image 21.

No free air or fluid.  Small fat containing umbilical hernia
identified.  The appendix is normal.  No bowel wall thickening or
focal segmental dilatation.  Uterus and ovaries are normal.

No acute osseous finding.
IMPRESSION: Soft tissue masses in the region of the porta hepatis most likely
represent reactive nodes in the setting of non alcoholic
steatohepatitis.  The larger of the two measured soft tissue masses
immediately abuts the duodenal bulb, without a clear intervening
fat plane, and therefore other differential considerations could
include leiomyoma, gastrointestinal stromal tumor, or less likely
ectopic pancreatic tissue.  Given that there are 2 adjacent similar
appearing soft tissue masses, this highly favors lymph node origin.
This could be confirmed at endoscopic ultrasound.

## 2013-10-03 ENCOUNTER — Ambulatory Visit: Payer: No Typology Code available for payment source | Admitting: Family Medicine

## 2013-10-22 ENCOUNTER — Ambulatory Visit
Admission: RE | Admit: 2013-10-22 | Discharge: 2013-10-22 | Disposition: A | Discharge status: Home / Self Care | Payer: No Typology Code available for payment source | Source: Ambulatory Visit | Attending: Family Medicine | Admitting: Family Medicine

## 2013-10-22 DIAGNOSIS — M25561 Pain in right knee: Secondary | ICD-10-CM

## 2013-10-24 ENCOUNTER — Ambulatory Visit (INDEPENDENT_AMBULATORY_CARE_PROVIDER_SITE_OTHER): Payer: Self-pay | Admitting: Family Medicine

## 2013-10-24 ENCOUNTER — Encounter: Payer: Self-pay | Admitting: Family Medicine

## 2013-10-24 VITALS — BP 102/71 | HR 74 | Ht 65.0 in | Wt 220.0 lb

## 2013-10-24 DIAGNOSIS — M25561 Pain in right knee: Secondary | ICD-10-CM

## 2013-10-24 DIAGNOSIS — M25569 Pain in unspecified knee: Secondary | ICD-10-CM

## 2013-10-24 NOTE — Patient Instructions (Addendum)
You have been scheduled for an appointment for your [facility] at [facility] in [place], [place] on 11/11/2013 at 3:30 pm.    [address] [place], [place] [address]  [phone] Main office number   [phone] financial counselor  Please bring $150 to the first appointment, and call financial counselor to discuss payment  Bring a disc with x-ray and MRI images, you can get this from [facility] [phone]

## 2013-10-27 NOTE — Progress Notes (Signed)
  Subjective:    Patient ID: Julie [name], female    DOB: 1981/01/25, 32 y.o.   MRN: [id number]  HPI  Followup right knee pain. Had MRI done.  Review of Systems Pertinent review of systems: negative for fever or unusual weight change.     Objective:   Physical Exam  Vital signs reviewed. GENERAL: Well developed, well nourished, no acute distress  KNEE: Right. No significant effusion. Medial joint line tenderness. IMAGING: MRI.Patellofemoral: No focal defect.  Medial: There is partial thickness cartilage loss of the medial  femoral condyle and medial tibial plateau with a small 6 mm focal  cartilage defect involving the medial femoral condyles. There are  small marginal osteophytes.  Lateral: There is mild partial thickness cartilage loss of the  lateral tibial plateau. There are small marginal osteophytes.  Joint: No significant joint effusion. Normal Hoffa's fat. No plical  thickening. There is a 6 mm loose body in the posterior medial joint  space.  Popliteal Fossa: No Baker cyst. Popliteus tendon intact.  Extensor Mechanism: Intact.  Bones: No focal marrow signal abnormality.  IMPRESSION:  1. Small oblique tear of the body of the lateral meniscus extending  to the inferior articular surface.  2. Cartilage abnormality primarily involving the medial femorotibial  compartment. There is a 6 mm loose body in the posterior medial  joint space.    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 She has a oblique meniscus tear lateral meniscus. She has thinning of the cartilage on the tibial plateau      INJECTION: Patient was given informed consent, signed copy in the chart. Appropriate time out was taken. Area prepped and draped in usual sterile fashion. One cc of methylprednisolone 40 mg/ml plus  4 cc of 1% lidocaine without epinephrine was injected into the right knee using a(n) anterior medial approach. The  patient tolerated the procedure well. There were no complications. Post procedure instructions were given.  Assessment & Plan:  #1. 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 Try to get her set [facility] at [facility] or [facility] for further evaluation for possible scope. Weight loss is imperative. Given her limited options she chose  steroid injection today and see her back in 2-3 months.

## 2013-12-03 ENCOUNTER — Ambulatory Visit (INDEPENDENT_AMBULATORY_CARE_PROVIDER_SITE_OTHER): Payer: No Typology Code available for payment source | Admitting: Family Medicine

## 2013-12-03 ENCOUNTER — Encounter: Payer: Self-pay | Admitting: Family Medicine

## 2013-12-03 VITALS — BP 119/78 | HR 72 | Temp 98.4°F | Ht 65.0 in | Wt 224.0 lb

## 2013-12-03 DIAGNOSIS — F329 Major depressive disorder, single episode, unspecified: Secondary | ICD-10-CM

## 2013-12-03 DIAGNOSIS — R42 Dizziness and giddiness: Secondary | ICD-10-CM

## 2013-12-03 DIAGNOSIS — R7309 Other abnormal glucose: Secondary | ICD-10-CM

## 2013-12-03 DIAGNOSIS — E119 Type 2 diabetes mellitus without complications: Secondary | ICD-10-CM | POA: Insufficient documentation

## 2013-12-03 DIAGNOSIS — F32A Depression, unspecified: Secondary | ICD-10-CM

## 2013-12-03 DIAGNOSIS — R7303 Prediabetes: Secondary | ICD-10-CM

## 2013-12-03 LAB — POCT GLYCOSYLATED HEMOGLOBIN (HGB A1C): Hemoglobin A1C: 6.2

## 2013-12-03 MED ORDER — ANTIPYRINE-BENZOCAINE 5.4-1.4 % OT SOLN: 3.0000 [drp] | OTIC | Status: DC | PRN

## 2013-12-03 MED ORDER — MECLIZINE HCL 50 MG PO TABS: 25.0000 mg | ORAL_TABLET | Freq: Three times a day (TID) | ORAL | Status: DC | PRN

## 2013-12-03 NOTE — Patient Instructions (Signed)
Thank you for coming in today  Your dizziness might be due to a loose crystal in your ear Please start the meclizine for the dizziness Please start the auralgan for the itchy drops.  Please come back to meet with Dr. Durene Cal in the next 4 weeks.

## 2013-12-03 NOTE — Progress Notes (Signed)
Julie [name] is a 32 y.o. female who presents to [facility] today for dizziness  Dizziness: makes pt very concerned. Onset 2 mo ago. Occurs 2 times daily. Feels like head is spinning. No vertigo. Occurs at random times w/o clear trigger. Denies fevers, abd pain, n/v, HA, vision change or hearing change. Feels like ears are draining. Feels like in bubble when dizzy. Ears itchy  DM: multiple family members w/ DM. Concerned that dizziness could be caused by DM. Last A1c 5.8.   LMP Aug 3rd. 2014. Uses condoms w/ husband. Upreg 6 wks ago neg.   The following portions of the patient's history were reviewed and updated as appropriate: allergies, current medications, past medical history, family and social history, and problem list.  Patient is a nonsmoker.   Past Medical History  Diagnosis Date  . Obesity 01/22/2012  . History of cesarean section     [id number]. Last 2 by c-section.     ROS as above otherwise neg.    Medications reviewed. Current Outpatient Prescriptions  Medication Sig Dispense Refill  . butalbital-acetaminophen-caffeine (ESGIC PLUS) 50-500-40 MG per tablet Take 1-2 tablets by mouth every 4 (four) hours as needed for pain or headache.  20 tablet  0  . diclofenac (VOLTAREN) 75 MG EC tablet Take 1 tablet (75 mg total) by mouth 2 (two) times daily.  60 tablet  0   No current facility-administered medications for this visit.    Exam:  BP 119/78  Pulse 72  Temp(Src) 98.4 F (36.9 C) (Oral)  Ht 5\' 5"  (1.651 m)  Wt 224 lb (101.606 kg)  BMI 37.28 kg/m2  LMP 07/20/2013 Gen: Well NAD HEENT: EOMI,  MMM, TM nml bilat Gait nml Dix hallpike + bilat w/ horizontal nystagmus and symtpomatic  No results found for this or any previous visit (from the past 72 hour(s)).  A/P (as seen in Problem list)  Pre-diabetes Last A1c 5.8 Will recheck today as greater than 1 year since check and current symptoms. Strong fmhx of DM.   Depression + Dix halpike Hearing intact so no concern  for Mernier's  High anxiety level and think this is contributing Consider further Epley's maneuver Meclizine 50 TID F/u PCP in 1 mo   Itchy ears: auralgan

## 2013-12-03 NOTE — Assessment & Plan Note (Signed)
+   Dix halpike Hearing intact so no concern for Mernier's  High anxiety level and think this is contributing Consider further Epley's maneuver Meclizine 50 TID F/u PCP in 1 mo

## 2013-12-03 NOTE — Assessment & Plan Note (Signed)
Last A1c 5.8 Will recheck today as greater than 1 year since check and current symptoms. Strong fmhx of DM.

## 2014-01-06 ENCOUNTER — Ambulatory Visit (INDEPENDENT_AMBULATORY_CARE_PROVIDER_SITE_OTHER): Payer: No Typology Code available for payment source | Admitting: Family Medicine

## 2014-01-06 ENCOUNTER — Encounter: Payer: Self-pay | Admitting: Family Medicine

## 2014-01-06 VITALS — BP 110/62 | HR 64 | Temp 97.9°F | Ht 65.0 in | Wt 220.0 lb

## 2014-01-06 DIAGNOSIS — N912 Amenorrhea, unspecified: Secondary | ICD-10-CM

## 2014-01-06 LAB — POCT URINE PREGNANCY: PREG TEST UR: NEGATIVE

## 2014-01-06 NOTE — Patient Instructions (Signed)
We are going to get an ultrasound of your uterus.   We will see you back to do your pap smear, endometrial biopsy and internal exam.   Thanks, Dr. Yong Channel

## 2014-01-08 ENCOUNTER — Encounter: Payer: Self-pay | Admitting: Family Medicine

## 2014-01-08 NOTE — Assessment & Plan Note (Addendum)
Given obesity, elevated a1c-suspect this may be PCOS. Patient has now had >2 years without having a period over last 4 years-given obesity and likely anovulatory state-will obtain endometrial biopsy at next visit (as well as completely pelvic exam and pap smear). Will assess for uterine abnormalities with pelvic u/s before that time. At next visit, will also get upreg before endometrial biopsy as well as lab work including prolactin and repeat TSH.   At next visit, will plan on initiating metformin 250mg  BID to see if this helps regulate period with plan to titrate up to 500mg  BID. If does not regulate, would consider OCP. Patient would also benefit from seeing Dr. Jenne Campus but this has been difficult to coordinate in the past.

## 2014-01-08 NOTE — Progress Notes (Signed)
  Julie Reddish, MD Phone: [phone]  Subjective:  Chief complaint-noted  Amenorrhea Patient states after birth of her last child she did not menstruate for 1.5 years until one was induced by provera by her ob/gyn (3-4 years ago). Record review shows a transvaginal u/s in 2009with 5.4mm endometrial stripe which was essentially normal except for small cyst on right ovary (likely a follicle). TSH/LH/FSH were normal. Patient had withdrawal bleed from provera and then had periods < every 6 weeks for some period of time. Over last 6 months, patient without a menstrual period. She is concerned because they had talked to her about the risk of endometrial cancer in the past. Patient took pregnancy test 1.5 months ago (negative) and had negative test in office today. Patient does admit to some left lower quadrant abdominal pain from time to time. Stabbing pain 3x a day lasting seconds then resolves. No known causative or relieving factors.   ROS-no fever/chills. No lightheadedness/dizziness. No unintentional weight loss or fatigue.   Past Medical History-NASH, obesity, depression, hyperlipidemia, OA, prediabetes Past menstrual history-describes heavy periods in the past when she was on OCP and on Mirena  Medications- reviewed and updated Current Outpatient Prescriptions  Medication Sig Dispense Refill  . meclizine (ANTIVERT) 50 MG tablet Take 0.5 tablets (25 mg total) by mouth 3 (three) times daily as needed.  90 tablet  0   No current facility-administered medications for this visit.    Objective: BP 110/62  Pulse 64  Temp(Src) 97.9 F (36.6 C) (Oral)  Ht 5\' 5"  (1.651 m)  Wt 220 lb (99.791 kg)  BMI 36.61 kg/m2 Gen: NAD, resting comfortably on table CV: RRR no murmurs rubs or gallops Lungs: CTAB no crackles, wheeze, rhonchi GU: patient defers today and wants pap/internal/endometrial biopsy at next visit all together  Assessment/Plan:  Secondary amenorrhea Given obesity, elevated  a1c-suspect this may be PCOS. Patient has now had >2 years without having a period over last 4 years-given obesity and likely anovulatory state-will obtain endometrial biopsy at next visit (as well as completely pelvic exam and pap smear). Will assess for uterine abnormalities with pelvic u/s before that time. At next visit, will also get upreg before endometrial biopsy as well as lab work including prolactin and repeat TSH.     Orders Placed This Encounter  Procedures  . US Transvaginal Non-OB    Standing Status: Future     Number of Occurrences:      Standing Expiration Date: 03/07/2015    Order Specific Question:  Reason for Exam (SYMPTOM  OR DIAGNOSIS REQUIRED)    Answer:  amenorrhea x 6 months    Order Specific Question:  Preferred imaging location?    Answer:  [facility]  . US Pelvis Complete    Standing Status: Future     Number of Occurrences:      Standing Expiration Date: 03/07/2015    Order Specific Question:  Reason for Exam (SYMPTOM  OR DIAGNOSIS REQUIRED)    Answer:  amenorrhea    Order Specific Question:  Preferred imaging location?    Answer:  [place] [facility]    No orders of the defined types were placed in this encounter.    >50% of 35 minute office visit was spent on counseling (patient concerned about potential cancer) and coordination of care

## 2014-01-09 ENCOUNTER — Ambulatory Visit (HOSPITAL_COMMUNITY)
Admission: RE | Admit: 2014-01-09 | Discharge: 2014-01-09 | Disposition: A | Discharge status: Home / Self Care | Payer: No Typology Code available for payment source | Source: Ambulatory Visit | Attending: Family Medicine | Admitting: Family Medicine

## 2014-01-09 DIAGNOSIS — N912 Amenorrhea, unspecified: Secondary | ICD-10-CM

## 2014-01-15 ENCOUNTER — Ambulatory Visit (INDEPENDENT_AMBULATORY_CARE_PROVIDER_SITE_OTHER): Payer: No Typology Code available for payment source | Admitting: Family Medicine

## 2014-01-15 DIAGNOSIS — R7303 Prediabetes: Secondary | ICD-10-CM

## 2014-01-15 DIAGNOSIS — R7309 Other abnormal glucose: Secondary | ICD-10-CM

## 2014-01-15 DIAGNOSIS — N912 Amenorrhea, unspecified: Secondary | ICD-10-CM

## 2014-01-15 DIAGNOSIS — N911 Secondary amenorrhea: Secondary | ICD-10-CM

## 2014-01-15 MED ORDER — METFORMIN HCL 500 MG PO TABS: 250.0000 mg | ORAL_TABLET | Freq: Two times a day (BID) | ORAL | Status: DC

## 2014-01-15 NOTE — Patient Instructions (Addendum)
Arrive at next appointment 15 minutes early for pregnancy test.  Take 600mg  of ibuprofen before you leave from home for the visit.   Por favor lleque 15 minutes antes de su cita para hacerle un examen de embarazo.  Tome 600mg  de ibuprofen antes de tu cita.   Biopsia de endometrio (Endometrial Biopsy) La biopsia de endometrio es un procedimiento en el que se toma una [place] de tejido del tero. Luego la muestra de tejido se observa en el microscopio para ver si el tejido es normal o anormal. El endometrio es el revestimiento interno del tero. Este procedimiento ayuda a determinar si est en el ciclo menstrual y de que modo los niveles de hormonas afectan el revestimiento del tero. Este procedimiento tambin se [place] para evaluar el sangrado uterino o para Electrical engineer de endometrio, tuberculosis, plipos o enfermedades inflamatorias.  INFORME A SU MDICO:  Cualquier alergia que tenga.  Todos los [employer] [place], incluyendo vitaminas, hierbas, gotas oftlmicas, cremas y medicamentos de venta libre.  Problemas previos que usted o los [employer] de su familia hayan tenido con el uso de anestsicos.  Enfermedades de [employer].  Cirugas previas.  Padecimientos mdicos.  Posible embarazo. RIESGOS Y COMPLICACIONES Generalmente es un procedimiento seguro. Sin embargo, Games developer procedimiento, pueden surgir complicaciones. Las complicaciones posibles son:  Hemorragias.  Infecciones plvicas  Lesin en la pared del tero con el instrumento utilizado para tomar la biopsia (raro). ANTES DEL PROCEDIMIENTO   Lleve un registro de sus ciclos menstruales segn las indicaciones de su mdico. Puede ser necesario que programe el procedimiento para un momento especfico del ciclo menstrual.  Tendr que llevar un apsito sanitario para usar despus del procedimiento.  Pdale a alguna persona que la lleve a su casa despus del procedimiento si le dan un medicamento para  relajarse (sedante). PROCEDIMIENTO   Le podrn administrar un medicamento para relajarse.  Deber recostarse en una camilla con los pies y las piernas elevados, como en el examen plvico.  El mdico insertar un instrumento (espculo) en la vagina para observar el cuello del tero.  El cuello del tero ser desinfectado con una solucin antisptica. Para adormecer el cuello del tero le aplicarn un medicamento (anestsico local ).  Se utilizar un frceps (tenculo) para [employer].  Se insertar un instrumento delgado, similar a una varilla (sonda uterina) a travs del cuello del tero para Teacher, adult education su longitud y la ubicacin en la que ser tomada la muestra para la biopsia.  Luego se pasa un tubo delgado y flexible (catter) a travs del cuello del tero hasta el tero. El catter se [place] para Barista de tejido del endometrio para la biopsia.  El catter y el espculo se retirarn y la [place] se enviar al laboratorio para ser examinada. DESPUS DEL PROCEDIMIENTO  Descansar en una sala de recuperacin hasta que est lista para volver a su casa.  Sentir clicos leves y tendr una pequea cantidad de sangrado vaginal durante algunos das despus del procedimiento. Esto es normal.  Asegrese de [employer]. Document Released: 08/06/2013 [facility] Patient Information 2014 [place].

## 2014-01-15 NOTE — Assessment & Plan Note (Signed)
Discussed results of ultrasound (normal). Discussed plan for endometrial biopsy. We discussed benefits and risks of procedure and what the procedure is like. Warned patient it is possible we would not get an adequate specimen.   Patient to arrive 15 minutes early for upreg and will take ibuprofen 600mg  before coming to visit.   Discussed treatment plan with metformin given possible PCOS (obesity, insulin resistance). Patient will pick up script and plan to start after biopsy to see if that regulates periods.

## 2014-01-15 NOTE — Progress Notes (Signed)
  Julie Reddish, MD Phone: [phone]  Subjective:  Chief complaint-noted  Amenorrhea Patient still without period since visit on 01/08/14. Patient had her ultrasound which revealed normal uterus and endometrial stripe at 70mm as well as ovaries. Patient presents to discuss where we go from here. Had originally planned for endometrial biopsy today but patient is not prepared for this and would like to discuss procedure.   During discussion, patient did reveal that she had an attempted biopsy around her vagina or uterus and she said "somethign blocked it".   ROS-no fever/chills. No lightheadedness/dizziness. No unintentional weight loss or fatigue.   Past Medical History-NASH, obesity, depression, hyperlipidemia, OA, prediabetes Past menstrual history-describes heavy periods in the past when she was on OCP and on Mirena  Medications- reviewed and updated Current Outpatient Prescriptions  Medication Sig Dispense Refill  . meclizine (ANTIVERT) 50 MG tablet Take 0.5 tablets (25 mg total) by mouth 3 (three) times daily as needed.  90 tablet  0   No current facility-administered medications for this visit.    Objective: There were no vitals taken for this visit. Gen: NAD, resting comfortably on table Neuro: grossly normal, moves all extremities  Assessment/Plan:  Secondary amenorrhea Discussed results of ultrasound (normal). Discussed plan for endometrial biopsy. We discussed benefits and risks of procedure and what the procedure is like. Warned patient it is possible we would not get an adequate specimen.   Patient to arrive 15 minutes early for upreg and will take ibuprofen 600mg  before coming to visit.   Discussed treatment plan with metformin given possible PCOS (obesity, insulin resistance). Patient will pick up script and plan to start after biopsy to see if that regulates periods.   Pre-diabetes Will start on metformin for secondary amenorrhea and continue to monitor a1c  every 3-6 months. Patient would benefit from nutrition visit to help with diet and weight loss.    Meds ordered this encounter  Medications  . metFORMIN (GLUCOPHAGE) 500 MG tablet    Sig: Take 0.5 tablets (250 mg total) by mouth 2 (two) times daily with a meal. Increase to full pill after one week.    Dispense:  30 tablet    Refill:  5

## 2014-01-15 NOTE — Assessment & Plan Note (Signed)
Will start on metformin for secondary amenorrhea and continue to monitor a1c every 3-6 months. Patient would benefit from nutrition visit to help with diet and weight loss.

## 2014-01-20 ENCOUNTER — Encounter: Payer: Self-pay | Admitting: Family Medicine

## 2014-01-20 ENCOUNTER — Other Ambulatory Visit (HOSPITAL_COMMUNITY)
Admission: RE | Admit: 2014-01-20 | Discharge: 2014-01-20 | Disposition: A | Discharge status: Home / Self Care | Payer: No Typology Code available for payment source | Source: Ambulatory Visit | Attending: Family Medicine | Admitting: Family Medicine

## 2014-01-20 ENCOUNTER — Ambulatory Visit (INDEPENDENT_AMBULATORY_CARE_PROVIDER_SITE_OTHER): Payer: No Typology Code available for payment source | Admitting: Family Medicine

## 2014-01-20 VITALS — BP 123/74 | HR 88 | Temp 98.2°F | Ht 66.0 in | Wt 223.0 lb

## 2014-01-20 DIAGNOSIS — Z1151 Encounter for screening for human papillomavirus (HPV): Secondary | ICD-10-CM | POA: Insufficient documentation

## 2014-01-20 DIAGNOSIS — Z124 Encounter for screening for malignant neoplasm of cervix: Secondary | ICD-10-CM

## 2014-01-20 DIAGNOSIS — Z01419 Encounter for gynecological examination (general) (routine) without abnormal findings: Secondary | ICD-10-CM | POA: Insufficient documentation

## 2014-01-20 DIAGNOSIS — N912 Amenorrhea, unspecified: Secondary | ICD-10-CM

## 2014-01-20 LAB — POCT URINE PREGNANCY: PREG TEST UR: NEGATIVE

## 2014-01-20 NOTE — Progress Notes (Signed)
PROCEDURE NOTE: Endometrial Biopsy Patient given informed consent, signed copy in the chart.  Appropriate time out taken. . The patient was placed in the lithotomy position and the cervix brought into view with sterile speculum.  The portio of cervix was cleansed x 3 with betadine swabs.  A tenaculum was placed in the anterior lip of the cervix.  A uterine sound was used to measure the uterus, only able to sound to 5cm although entry through cervical os without difficulty. A pipelle was introduced  into the uterus, suction created,  and an attempt was made to sample endometrium. No tissue was aspirated, just blood and an attempt was made again without success. Due to patient discomfort and preference, the procedure was stopped after this second attempt. All equipment was removed and accounted for.  The patient tolerated the procedure well.  Minimal spotting type bleeding.  Patient given post procedure instructions. This was not sent to pathology.   Of note, patient mentioned at last visit that "patient did reveal that she had an attempted biopsy around her vagina or uterus and she said "something blocked it".". This biopsy attempt was in [place] but patient cannot describe why she had this procedure.   Patient was scheduled for a visit with women's clinic to repeat the biopsy attempt. If unsuccessful, could refer to women's clinic vs. Treating with metformin or OCPs to regulate menstruation.

## 2014-01-20 NOTE — Patient Instructions (Signed)
We attempted an endometrial biopsy today but were not able to get tissue. You may have some bleeding and cramping tonight. Bleeding should not be heavier than a period and should not persist more than 2 days or so. Cramping should be somewhat like a period. If anything is worse than this or you have a fever, go to the women's hospital.   We did get your pap smear which we will send out to lab. For your labs, I will send you a letter if there are no medication changes needed. I will call you if we need to discuss your lab results.   Schedule an appointment with our women's clinic here at our clinic (Dr. Gwendlyn Deutscher or Dr. Nori Riis and they will repeat an attempt).   Thanks, Dr. Yong Channel  Health Maintenance Due  Topic Date Due  . Pap Smear -completed today 11/30/2012

## 2014-01-22 ENCOUNTER — Ambulatory Visit: Payer: No Typology Code available for payment source

## 2014-01-23 ENCOUNTER — Encounter: Payer: Self-pay | Admitting: Family Medicine

## 2014-01-29 ENCOUNTER — Ambulatory Visit: Payer: No Typology Code available for payment source

## 2014-03-09 ENCOUNTER — Ambulatory Visit: Payer: Self-pay

## 2014-03-12 ENCOUNTER — Ambulatory Visit: Payer: No Typology Code available for payment source

## 2014-04-15 ENCOUNTER — Encounter: Payer: Self-pay | Admitting: Family Medicine

## 2014-04-15 ENCOUNTER — Ambulatory Visit (INDEPENDENT_AMBULATORY_CARE_PROVIDER_SITE_OTHER): Payer: No Typology Code available for payment source | Admitting: Family Medicine

## 2014-04-15 VITALS — BP 110/74 | HR 76 | Temp 97.1°F | Ht 66.0 in | Wt 220.0 lb

## 2014-04-15 DIAGNOSIS — R1033 Periumbilical pain: Secondary | ICD-10-CM

## 2014-04-15 DIAGNOSIS — K429 Umbilical hernia without obstruction or gangrene: Secondary | ICD-10-CM | POA: Insufficient documentation

## 2014-04-15 DIAGNOSIS — R0602 Shortness of breath: Secondary | ICD-10-CM

## 2014-04-15 NOTE — Progress Notes (Signed)
Garret Reddish, MD Phone: [phone]  Subjective:   Julie [name] is a 33 y.o. year old very pleasant female patient who presents with the following:  Belly button pain worse after fall Describes pain for several months worse after she slipped and fell last week. Playing outside and slipped forward onto stomach. No radiation. Pain up to 10/10 but typically around 8/10. Sensitive to clothes rubbing up against it. Feels like "something pressing down very hard" or like "someone pulling belly button from inside". Pain is worse with deep breaths or if area is pressed. Present most of time in one. Has tried some OTC medicines.  Surgical History- history of 2 c-sections with vertical incision.  ROS- no rash, no fever/chills.   Mid back pain and mild shortness of breath Also complains of mild back pain (had baseline mid back aching for about a week prior to fall) after fall. She states she has not been able to take a good deep breath since this time and this causes sensation of shortness of breath. No recent travel. No leg swelling. No history of DVT.  ROS-no fecal or urinary incontinence or leg weakness or saddle anesthesia. No cough or chest pain.   Past Medical History- Patient Active Problem List   Diagnosis Date Noted  . Pre-diabetes 12/03/2013    Priority: Medium  . Enlargement of lymph nodes 03/03/2013    Priority: Medium  . NASH (nonalcoholic steatohepatitis) 02/25/2013    Priority: Medium  . Depression 05/11/2012    Priority: Medium  . Hyperlipidemia LDL goal < 160 04/29/2012    Priority: Medium  . 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 Obesity, Class II, BMI 35-39.9 01/22/2012    Priority: Medium  . Rash 10/07/2012    Priority: Low  . Osteoarthritis 07/23/2012    Priority: Low  . Lipoma of lower extremity 02/27/2012    Priority: Low  . Periumbilical abdominal pain 04/15/2014  . Dizziness 12/03/2013  . Knee meniscus pain 09/09/2013    Medications- antivert and metformin on med list but patient denies taking any medications currently.    Objective: BP 110/74  Pulse 76  Temp(Src) 97.1 F (36.2 C) (Oral)  Ht 5\' 6"  (1.676 m)  Wt 220 lb (99.791 kg)  BMI 35.53 kg/m2  SpO2 99% Gen: NAD, resting comfortably on table CV: RRR no murmurs rubs or gallops Lungs: CTAB no crackles, wheeze, rhonchi, states pain with deep breaths Abdomen: soft//nondistended/normal bowel sounds. No rebound or guarding. Obese abdomen. nontender in all 4 quadrants but Tender with palpation around umbilicus, difficult exam due to obesity but severe pain with deep palpation Ext: no edema, equal size bilaterally Skin: warm, dry, no rash   Back - Normal skin, Spine with normal alignment and no deformity.  No tenderness to vertebral process palpation.  Paraspinous muscles are not tender and without spasm (from cervical to lumbar spine).  Range of motion is full at neck and lumbar sacral regions.  Neuro: grossly normal, moves all extremities, 5/5 strength lower and upper extremities, no salddle anesthesia  Assessment/Plan:  Periumbilical abdominal pain Possibly related to scar tissue from prior surgeries. Want to assess for periumbilical hernia given pain with palpation isolated to umbilicus. Discussed possibility that scan would be unremarkable but that we wanted to rule out more major issues, patient in agreement and knows may not find a cause and may continue to have some pain.   Mid back pain and mild shortness of breath Unclear etiology. No cough and sats at  99%. Wells score of 0 for PE. Will obtain CXR to further evaluate.   Orders Placed This Encounter  Procedures  . CT Abdomen Pelvis Wo Contrast    Standing Status: Future     Number of Occurrences:      Standing Expiration Date: 07/16/2015    Order Specific Question:  Reason for Exam (SYMPTOM  OR DIAGNOSIS REQUIRED)    Answer:  periumbilical pain-? hernia near site of prior c-section    Order  Specific Question:  Is the patient pregnant?    Answer:  No    Order Specific Question:  Preferred imaging location?    Answer:  [facility]  . DG Chest 2 View    Standing Status: Future     Number of Occurrences:      Standing Expiration Date: 06/16/2015    Order Specific Question:  Reason for Exam (SYMPTOM  OR DIAGNOSIS REQUIRED)    Answer:  pain with deep inspiration, mild shortness of breath but no cough    Order Specific Question:  Is the patient pregnant?    Answer:  No    Order Specific Question:  Preferred imaging location?    Answer:  [facility]

## 2014-04-15 NOTE — Assessment & Plan Note (Signed)
Possibly related to scar tissue from prior surgeries. Want to assess for periumbilical hernia given pain with palpation isolated to umbilicus. Discussed possibility that scan would be unremarkable but that we wanted to rule out more major issues, patient in agreement and knows may not find a cause and may continue to have some pain.

## 2014-04-15 NOTE — Patient Instructions (Addendum)
See me back 1 week after scan to follow up results and talk about bloodwork for prediabetes.  Follow up sooner if worsening shortness of breath, worsening abdominal pain.  You can take Tylenol 650 mg every 6-8 hours and you can try ibuprofen on top of that if needed.

## 2014-04-17 ENCOUNTER — Encounter (HOSPITAL_COMMUNITY): Payer: Self-pay

## 2014-04-17 ENCOUNTER — Other Ambulatory Visit: Payer: Self-pay | Admitting: Family Medicine

## 2014-04-17 ENCOUNTER — Ambulatory Visit (HOSPITAL_COMMUNITY)
Admission: RE | Admit: 2014-04-17 | Discharge: 2014-04-17 | Disposition: A | Discharge status: Home / Self Care | Payer: No Typology Code available for payment source | Source: Ambulatory Visit | Attending: Family Medicine | Admitting: Family Medicine

## 2014-04-17 DIAGNOSIS — R1033 Periumbilical pain: Secondary | ICD-10-CM

## 2014-04-17 DIAGNOSIS — R0602 Shortness of breath: Secondary | ICD-10-CM

## 2014-04-17 DIAGNOSIS — R109 Unspecified abdominal pain: Secondary | ICD-10-CM | POA: Insufficient documentation

## 2014-04-17 LAB — HCG, SERUM, QUALITATIVE: PREG SERUM: NEGATIVE

## 2014-04-17 NOTE — Addendum Note (Signed)
Addended by: Marin Olp on: 04/17/2014 01:57 PM   Modules accepted: Orders

## 2014-04-17 NOTE — Progress Notes (Signed)
Pt at hospital for x-ray, sent to lab for pregnancy test but only a POCT pregnancy test in EPIC.  Order placed for qualitative serum pregnancy test.  Order faxed to lab   [facility], MLS

## 2014-04-20 ENCOUNTER — Telehealth: Payer: Self-pay | Admitting: *Deleted

## 2014-04-20 NOTE — Telephone Encounter (Signed)
Message copied by Valerie Roys on Mon Apr 20, 2014  9:01 AM ------      Message from: Marin Olp      Created: Fri Apr 17, 2014  4:16 PM       Please let patient know that patient has normal CXR through language line (she asks [place] not to use in person interpreters typically) ------

## 2014-04-20 NOTE — Telephone Encounter (Signed)
LM for patient to call back.  Will forward to [employer] to call her and leave a detailed message in spanish of normal results. Iverna Hammac,CMA

## 2014-04-21 NOTE — Telephone Encounter (Signed)
Lvm hopefully pt will call us back.  [place]

## 2014-04-27 ENCOUNTER — Encounter: Payer: Self-pay | Admitting: Family Medicine

## 2014-04-27 ENCOUNTER — Ambulatory Visit (INDEPENDENT_AMBULATORY_CARE_PROVIDER_SITE_OTHER): Payer: No Typology Code available for payment source | Admitting: Family Medicine

## 2014-04-27 VITALS — BP 117/80 | HR 67 | Temp 98.1°F | Ht 66.0 in | Wt 221.0 lb

## 2014-04-27 DIAGNOSIS — R0789 Other chest pain: Secondary | ICD-10-CM

## 2014-04-27 DIAGNOSIS — M549 Dorsalgia, unspecified: Secondary | ICD-10-CM

## 2014-04-27 DIAGNOSIS — R1033 Periumbilical pain: Secondary | ICD-10-CM

## 2014-04-27 DIAGNOSIS — R109 Unspecified abdominal pain: Secondary | ICD-10-CM

## 2014-04-27 DIAGNOSIS — R599 Enlarged lymph nodes, unspecified: Secondary | ICD-10-CM

## 2014-04-27 LAB — COMPREHENSIVE METABOLIC PANEL
ALBUMIN: 4.4 g/dL (ref 3.5–5.2)
ALK PHOS: 71 U/L (ref 39–117)
ALT: 45 U/L — AB (ref 0–35)
AST: 26 U/L (ref 0–37)
BILIRUBIN TOTAL: 0.6 mg/dL (ref 0.2–1.2)
BUN: 7 mg/dL (ref 6–23)
CO2: 23 mEq/L (ref 19–32)
Calcium: 9.4 mg/dL (ref 8.4–10.5)
Chloride: 101 mEq/L (ref 96–112)
Creat: 0.5 mg/dL (ref 0.50–1.10)
GLUCOSE: 104 mg/dL — AB (ref 70–99)
POTASSIUM: 3.7 meq/L (ref 3.5–5.3)
SODIUM: 135 meq/L (ref 135–145)
TOTAL PROTEIN: 7.9 g/dL (ref 6.0–8.3)

## 2014-04-27 LAB — CBC WITH DIFFERENTIAL/PLATELET
BASOS PCT: 1 % (ref 0–1)
Basophils Absolute: 0.1 10*3/uL (ref 0.0–0.1)
Eosinophils Absolute: 0.5 10*3/uL (ref 0.0–0.7)
Eosinophils Relative: 7 % — ABNORMAL HIGH (ref 0–5)
HCT: 41.4 % (ref 36.0–46.0)
HEMOGLOBIN: 14.3 g/dL (ref 12.0–15.0)
Lymphocytes Relative: 35 % (ref 12–46)
Lymphs Abs: 2.5 10*3/uL (ref 0.7–4.0)
MCH: 30.6 pg (ref 26.0–34.0)
MCHC: 34.5 g/dL (ref 30.0–36.0)
MCV: 88.5 fL (ref 78.0–100.0)
MONOS PCT: 7 % (ref 3–12)
Monocytes Absolute: 0.5 10*3/uL (ref 0.1–1.0)
NEUTROS ABS: 3.6 10*3/uL (ref 1.7–7.7)
NEUTROS PCT: 50 % (ref 43–77)
PLATELETS: 178 10*3/uL (ref 150–400)
RBC: 4.68 MIL/uL (ref 3.87–5.11)
RDW: 13.3 % (ref 11.5–15.5)
WBC: 7.1 10*3/uL (ref 4.0–10.5)

## 2014-04-27 LAB — LIPASE: Lipase: 16 U/L (ref 0–75)

## 2014-04-27 MED ORDER — ALBUTEROL SULFATE (2.5 MG/3ML) 0.083% IN NEBU
2.5000 mg | INHALATION_SOLUTION | Freq: Once | RESPIRATORY_TRACT | Status: AC
  Administered 2014-04-27: 2.5 mg via RESPIRATORY_TRACT

## 2014-04-27 NOTE — Patient Instructions (Signed)
Let's see each other back next Monday to check in. If your symptoms get worse, you can call to be worked in before then.   Orders Placed This Encounter  Procedures  . CBC with Differential  . Comprehensive metabolic panel  . Lipase   Thanks, Dr. Yong Channel

## 2014-04-28 DIAGNOSIS — M549 Dorsalgia, unspecified: Secondary | ICD-10-CM | POA: Insufficient documentation

## 2014-04-28 NOTE — Assessment & Plan Note (Signed)
Pain discovered to be caused by periumbilical hernia. Placed referral to general surgery and let patient know with orange card, may be several months or potentially may not be seen. She is to monitor symptoms. If worsens, could consider referral to [facility] for payment plan. No red flags on history and fat containing only on CT.

## 2014-04-28 NOTE — Progress Notes (Signed)
Garret Reddish, MD Phone: [phone]  Subjective:   Julie [name] is a 34 y.o. year old very pleasant female patient who presents with the following:  Mid back pain and mild shortness of breath Patient also seen 2 weeks ago for a mild back ache that had worsened after a fall. Pain described a pleuritic. States due to pain she wants to breathe more shallow which she states makes her mildly short of breath. Shortness of breath has worsened some. SHe has intermittently heard a wheeze. Daughter has asthma but she has never had any lung disease. SHe has been afebrile. DOes have a mild productive cough which is new since last visit. Once again- No recent travel,leg swelling, history of DVT, hemoptysis. Not on birth control.  ROS-no fecal or urinary incontinence or leg weakness or saddle anesthesia. Occasionally does feel tight in chest if she does not take a deep breath but improves with a good deep breath. States got slightly lightheaded once when wanted to take a deep breath.   Periumbilical hernia Pain for several months worse over last few weeks after a fall onto her stomach. Sent for CT scan at last visit for concern of hernia given periumbilical pain and found to have small fat containing hernia. Patient states still bothering her and would like to consider surgery. Still using OTC pain medicines as needed.   Surgical History- history of 2 c-sections with vertical incision.  ROS- no rash, no fever/chills. No nausea/vomiting/diarrhea/constipation  Past Medical History- Patient Active Problem List   Diagnosis Date Noted  . Pre-diabetes 12/03/2013    Priority: Medium  . Enlargement of lymph nodes 03/03/2013    Priority: Medium  . NASH (nonalcoholic steatohepatitis) 02/25/2013    Priority: Medium  . Depression 05/11/2012    Priority: Medium  . Hyperlipidemia LDL goal < 160 04/29/2012    Priority: Medium  . 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 Obesity, Class  II, BMI 35-39.9 01/22/2012    Priority: Medium  . Periumbilical abdominal pain 04/15/2014    Priority: Low  . Rash 10/07/2012    Priority: Low  . Osteoarthritis 07/23/2012    Priority: Low  . Lipoma of lower extremity 02/27/2012    Priority: Low  . Knee meniscus pain 09/09/2013   Medications- antivert and metformin (prediabetes) and  on med list but patient denies taking any medications currently.    Objective: BP 117/80  Pulse 67  Temp(Src) 98.1 F (36.7 C) (Oral)  Ht 5\' 6"  (1.676 m)  Wt 221 lb (100.245 kg)  BMI 35.69 kg/m2  SpO2 98%  PF 293 L/min Gen: NAD, resting comfortably on table CV: RRR no murmurs rubs or gallops Lungs: patient with occasional diffuse wheeze that seems to be most focal in LLL and also with mild rhonchi in this area. Otherwise, clear to auscultation bilaterally. Pain with deep breaths but is able to breath deeply. No labored breathing Pre albuterol treatment PF 293 and post near 250. Wheeze mildly improved after albuterol but no improvement in symptoms.  Abdomen: soft//nondistended/normal bowel sounds. No rebound or guarding. Obese abdomen. nontender in all 4 quadrants but Tender with palpation around umbilicus, difficult exam due to obesity but severe pain with deep palpation Ext: no edema, equal size bilaterally at 10 cm below tibial plateau Skin: warm, dry, no rash   Back - Normal skin, Spine with normal alignment and no deformity.  No tenderness to vertebral process palpation.  Paraspinous muscles are not tender and without spasm (from cervical  to lumbar spine).  Range of motion is full at neck and lumbar sacral regions.  Neuro: grossly normal, moves all extremities, 5/5 strength lower and upper extremities, no salddle anesthesia  Assessment/Plan:  Enlargement of lymph nodes Follow up CT scan without mention of enlarged lymph nodes. I plan to contact radiology to specifically ask for a comment on prior ? Lymph node enlargement. If any lymph node  enlargement was noted, could heighten suspicion for malignancy or mass as cause of pleuritic mid back pain.   Periumbilical hernia Pain discovered to be caused by periumbilical hernia. Placed referral to general surgery and let patient know with orange card, may be several months or potentially may not be seen. She is to monitor symptoms. If worsens, could consider referral to [facility] for payment plan. No red flags on history and fat containing only on CT.   Mid back pain (pleuritic) Unclear etiology. Pain mainly with deep inspiration leading to shallow breathing.  Sats 98%. Perc negative and wells score of 0 for PE. CXR 2 weeks ago  Which was unreamarkable. Has developed cough and some mild wheeze. Possible bronchitis. COuld consider repeat CXR if persistent to evaluate for pneumonia again. ALbuterol did not help symptoms. ? Effort given weak peak flow before treatment and worse after treatment. Patient does not appear in any distress on my exam. Will have close follow up next week and sooner if symptoms worsen. Checked some basic labs which were unremarkable (CBC, CMP, lipase) and did not reveal etiology. Precepted case with Dr. Andria Frames who independently evaluated patient. Possible anxiety component.    Orders Placed This Encounter  Procedures  . CBC with Differential  . Comprehensive metabolic panel  . Lipase

## 2014-04-28 NOTE — Assessment & Plan Note (Signed)
Follow up CT scan without mention of enlarged lymph nodes. I plan to contact radiology to specifically ask for a comment on prior ? Lymph node enlargement. If any lymph node enlargement was noted, could heighten suspicion for malignancy or mass as cause of pleuritic mid back pain.

## 2014-04-28 NOTE — Assessment & Plan Note (Signed)
Unclear etiology. Pain mainly with deep inspiration leading to shallow breathing.  Sats 98%. Perc negative and wells score of 0 for PE. CXR 2 weeks ago  Which was unreamarkable. Has developed cough and some mild wheeze. Possible bronchitis. COuld consider repeat CXR if persistent to evaluate for pneumonia again. ALbuterol did not help symptoms. ? Effort given weak peak flow before treatment and worse after treatment. Patient does not appear in any distress on my exam. Will have close follow up next week and sooner if symptoms worsen. Checked some basic labs which were unremarkable (CBC, CMP, lipase) and did not reveal etiology. Precepted case with Dr. Andria Frames who independently evaluated patient. Possible anxiety component.

## 2014-04-29 ENCOUNTER — Emergency Department (HOSPITAL_COMMUNITY): Payer: No Typology Code available for payment source

## 2014-04-29 ENCOUNTER — Encounter (HOSPITAL_COMMUNITY): Payer: Self-pay | Admitting: Emergency Medicine

## 2014-04-29 ENCOUNTER — Other Ambulatory Visit: Payer: Self-pay

## 2014-04-29 ENCOUNTER — Emergency Department (HOSPITAL_COMMUNITY)
Admission: EM | Admit: 2014-04-29 | Discharge: 2014-04-29 | Disposition: A | Discharge status: Home / Self Care | Payer: No Typology Code available for payment source | Attending: Emergency Medicine | Admitting: Emergency Medicine

## 2014-04-29 DIAGNOSIS — Z872 Personal history of diseases of the skin and subcutaneous tissue: Secondary | ICD-10-CM | POA: Insufficient documentation

## 2014-04-29 DIAGNOSIS — Z3202 Encounter for pregnancy test, result negative: Secondary | ICD-10-CM | POA: Insufficient documentation

## 2014-04-29 DIAGNOSIS — R202 Paresthesia of skin: Secondary | ICD-10-CM

## 2014-04-29 DIAGNOSIS — R0602 Shortness of breath: Secondary | ICD-10-CM | POA: Insufficient documentation

## 2014-04-29 DIAGNOSIS — R5383 Other fatigue: Secondary | ICD-10-CM

## 2014-04-29 DIAGNOSIS — R5381 Other malaise: Secondary | ICD-10-CM | POA: Insufficient documentation

## 2014-04-29 DIAGNOSIS — E669 Obesity, unspecified: Secondary | ICD-10-CM | POA: Insufficient documentation

## 2014-04-29 DIAGNOSIS — R109 Unspecified abdominal pain: Secondary | ICD-10-CM | POA: Insufficient documentation

## 2014-04-29 DIAGNOSIS — R197 Diarrhea, unspecified: Secondary | ICD-10-CM | POA: Insufficient documentation

## 2014-04-29 DIAGNOSIS — IMO0002 Reserved for concepts with insufficient information to code with codable children: Secondary | ICD-10-CM | POA: Insufficient documentation

## 2014-04-29 DIAGNOSIS — R209 Unspecified disturbances of skin sensation: Secondary | ICD-10-CM | POA: Insufficient documentation

## 2014-04-29 DIAGNOSIS — R062 Wheezing: Secondary | ICD-10-CM

## 2014-04-29 DIAGNOSIS — R079 Chest pain, unspecified: Secondary | ICD-10-CM | POA: Insufficient documentation

## 2014-04-29 LAB — URINALYSIS, ROUTINE W REFLEX MICROSCOPIC
BILIRUBIN URINE: NEGATIVE
Glucose, UA: NEGATIVE mg/dL
Hgb urine dipstick: NEGATIVE
Ketones, ur: NEGATIVE mg/dL
Leukocytes, UA: NEGATIVE
Nitrite: NEGATIVE
Protein, ur: NEGATIVE mg/dL
SPECIFIC GRAVITY, URINE: 1.022 (ref 1.005–1.030)
UROBILINOGEN UA: 1 mg/dL (ref 0.0–1.0)
pH: 5.5 (ref 5.0–8.0)

## 2014-04-29 LAB — BASIC METABOLIC PANEL
BUN: 9 mg/dL (ref 6–23)
CO2: 21 mEq/L (ref 19–32)
Calcium: 9.7 mg/dL (ref 8.4–10.5)
Chloride: 100 mEq/L (ref 96–112)
Creatinine, Ser: 0.43 mg/dL — ABNORMAL LOW (ref 0.50–1.10)
Glucose, Bld: 110 mg/dL — ABNORMAL HIGH (ref 70–99)
POTASSIUM: 3.9 meq/L (ref 3.7–5.3)
SODIUM: 138 meq/L (ref 137–147)

## 2014-04-29 LAB — CBC
HCT: 40.6 % (ref 36.0–46.0)
HEMOGLOBIN: 13.8 g/dL (ref 12.0–15.0)
MCH: 30.9 pg (ref 26.0–34.0)
MCHC: 34 g/dL (ref 30.0–36.0)
MCV: 91 fL (ref 78.0–100.0)
PLATELETS: 185 10*3/uL (ref 150–400)
RBC: 4.46 MIL/uL (ref 3.87–5.11)
RDW: 12.3 % (ref 11.5–15.5)
WBC: 14.2 10*3/uL — ABNORMAL HIGH (ref 4.0–10.5)

## 2014-04-29 LAB — I-STAT TROPONIN, ED: Troponin i, poc: 0 ng/mL (ref 0.00–0.08)

## 2014-04-29 LAB — POC URINE PREG, ED: Preg Test, Ur: NEGATIVE

## 2014-04-29 LAB — PRO B NATRIURETIC PEPTIDE: PRO B NATRI PEPTIDE: 16.4 pg/mL (ref 0–125)

## 2014-04-29 MED ORDER — METHYLPREDNISOLONE SODIUM SUCC 125 MG IJ SOLR
125.0000 mg | Freq: Once | INTRAMUSCULAR | Status: AC
  Administered 2014-04-29: 125 mg via INTRAVENOUS
  Filled 2014-04-29: qty 2

## 2014-04-29 MED ORDER — PREDNISONE 10 MG PO TABS: 40.0000 mg | ORAL_TABLET | Freq: Every day | ORAL | Status: DC

## 2014-04-29 MED ORDER — IPRATROPIUM BROMIDE 0.02 % IN SOLN
0.5000 mg | Freq: Once | RESPIRATORY_TRACT | Status: AC
  Administered 2014-04-29: 0.5 mg via RESPIRATORY_TRACT
  Filled 2014-04-29: qty 2.5

## 2014-04-29 MED ORDER — AEROCHAMBER PLUS W/MASK MISC
1.0000 | Freq: Once | Status: AC
  Administered 2014-04-29: 1
  Filled 2014-04-29: qty 1

## 2014-04-29 MED ORDER — ALBUTEROL SULFATE HFA 108 (90 BASE) MCG/ACT IN AERS: 2.0000 | INHALATION_SPRAY | RESPIRATORY_TRACT | Status: DC | PRN

## 2014-04-29 MED ORDER — ALBUTEROL SULFATE (2.5 MG/3ML) 0.083% IN NEBU
5.0000 mg | INHALATION_SOLUTION | Freq: Once | RESPIRATORY_TRACT | Status: AC
  Administered 2014-04-29: 5 mg via RESPIRATORY_TRACT
  Filled 2014-04-29: qty 6

## 2014-04-29 MED ORDER — CETIRIZINE HCL 10 MG PO TABS: 10.0000 mg | ORAL_TABLET | Freq: Every day | ORAL | Status: DC

## 2014-04-29 MED ORDER — ALBUTEROL SULFATE HFA 108 (90 BASE) MCG/ACT IN AERS
2.0000 | INHALATION_SPRAY | Freq: Once | RESPIRATORY_TRACT | Status: AC
  Administered 2014-04-29: 2 via RESPIRATORY_TRACT
  Filled 2014-04-29: qty 6.7

## 2014-04-29 NOTE — ED Notes (Signed)
PT reports intermittent chest pain for past month, but today has had increase in shortness of breath. Pt has some labored breathing. O2 sats 97% RA. Pt describes pain as pressure, central chest.

## 2014-04-29 NOTE — ED Notes (Signed)
Patient transported to CT 

## 2014-04-29 NOTE — Discharge Instructions (Signed)
1. Medications: albuterol, prednisone, usual home medications 2. Treatment: rest, drink plenty of fluids,  3. Follow Up: Please followup with your primary doctor within 2 days for discussion of your diagnoses and further evaluation after today's visit;   Bronchospasm, Adult A bronchospasm is when the tubes that carry air in and out of your lungs (airwarys) spasm or tighten. During a bronchospasm it is hard to breathe. This is because the airways get smaller. A bronchospasm can be triggered by:  Allergies. These may be to animals, pollen, food, or mold.  Infection. This is a common cause of bronchospasm.  Exercise.  Irritants. These include pollution, cigarette smoke, strong odors, aerosol sprays, and paint fumes.  Weather changes.  Stress.  Being emotional. HOME CARE   Always have a plan for getting help. Know when to call your doctor and local emergency services (911 in the [place] Know where you can get emergency care.  Only take medicines as told by your doctor.  If you were prescribed an inhaler or nebulizer machine, ask your doctor how to use it correctly. Always use a spacer with your inhaler if you were given one.  Stay calm during an attack. Try to relax and breathe more slowly.  Control your home environment:  Change your heating and air conditioning filter at least once a month.  Limit your use of fireplaces and wood stoves.  Do not  smoke. Do not  allow smoking in your home.  Avoid perfumes and fragrances.  Get rid of pests (such as roaches and mice) and their droppings.  Throw away plants if you see mold on them.  Keep your house clean and dust free.  Replace carpet with wood, tile, or vinyl flooring. Carpet can trap dander and dust.  Use allergy-proof pillows, mattress covers, and box spring covers.  Wash bed sheets and blankets every week in hot water. Dry them in a dryer.  Use blankets that are made of polyester or cotton.  Wash hands  frequently. GET HELP IF:  You have muscle aches.  You have chest pain.  The thick spit you spit or cough up (sputum) changes from clear or white to yellow, green, gray, or bloody.  The thick spit you spit or cough up gets thicker.  There are problems that may be related to the medicine you are given such as:  A rash.  Itching.  Swelling.  Trouble breathing. GET HELP RIGHT AWAY IF:  You feel you cannot breathe or catch your breath.  You cannot stop coughing.  Your treatment is not helping you breathe better. MAKE SURE YOU:   Understand these instructions.  Will watch your condition.  Will get help right away if you are not doing well or get worse. Document Released: 10/01/2009 Document Revised: 08/06/2013 Document Reviewed: 05/27/2013 [facility] Patient Information 2014 [place].

## 2014-04-29 NOTE — ED Notes (Signed)
PA at the bedside.
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
## 2014-04-29 NOTE — ED Notes (Signed)
Pt ambulated independently to the restroom. During ambulation pt SpO2 stayed at and above 96%.

## 2014-04-29 NOTE — ED Provider Notes (Signed)
Medical screening examination/treatment/procedure(s) were conducted as a shared visit with non-physician practitioner(s) and myself.  I personally evaluated the patient during the encounter.   EKG Interpretation None      Patient here complaining of chest discomfort that is worse with movement. She had bronchospasm on arrival that was treated with bronchodilators. She has noted persistent left upper extremity weakness with associated pain. Her neurological exam is stable. Head CT is negative here. Suspect musculoskeletal etiology.  Leota Jacobsen, MD 04/29/14 2136

## 2014-05-04 NOTE — ED Provider Notes (Signed)
Medical screening examination/treatment/procedure(s) were conducted as a shared visit with non-physician practitioner(s) and myself.  I personally evaluated the patient during the encounter.   EKG Interpretation None       Tyrik Stetzer T Mackensi Mahadeo, MD 05/04/14 0738 

## 2014-05-08 ENCOUNTER — Ambulatory Visit: Payer: No Typology Code available for payment source | Admitting: Family Medicine

## 2014-05-26 ENCOUNTER — Encounter: Payer: Self-pay | Admitting: Family Medicine

## 2014-05-28 ENCOUNTER — Encounter: Payer: Self-pay | Admitting: Family Medicine

## 2014-05-28 ENCOUNTER — Ambulatory Visit: Payer: No Typology Code available for payment source | Admitting: Family Medicine

## 2014-06-03 ENCOUNTER — Encounter: Payer: Self-pay | Admitting: *Deleted

## 2014-06-04 ENCOUNTER — Encounter: Payer: Self-pay | Admitting: Family Medicine

## 2014-06-04 ENCOUNTER — Ambulatory Visit (INDEPENDENT_AMBULATORY_CARE_PROVIDER_SITE_OTHER): Payer: No Typology Code available for payment source | Admitting: Family Medicine

## 2014-06-04 VITALS — BP 107/76 | HR 80 | Temp 98.2°F | Wt 223.0 lb

## 2014-06-04 DIAGNOSIS — M549 Dorsalgia, unspecified: Secondary | ICD-10-CM

## 2014-06-04 DIAGNOSIS — H60393 Other infective otitis externa, bilateral: Secondary | ICD-10-CM

## 2014-06-04 DIAGNOSIS — H60399 Other infective otitis externa, unspecified ear: Secondary | ICD-10-CM

## 2014-06-04 MED ORDER — CIPROFLOXACIN-DEXAMETHASONE 0.3-0.1 % OT SUSP: 4.0000 [drp] | Freq: Two times a day (BID) | OTIC | Status: DC

## 2014-06-04 NOTE — Patient Instructions (Signed)
Glad your breathing got better. We considered prednisone but it seemed to bother your skin. This may last another month or so. Follow up if persists past then or worsens.   For your ear, seems you have external ear infection. Use drops for 7 days.   Otitis Externa (Otitis Externa)  La otitis externa es una infeccin bacteriana o por hongos en el conducto auditivo externo. Esta es el rea desde el tmpano hasta el exterior de la [place]. Tambin se la llama "odo de nadador". CAUSAS  Las posibles causas de la infeccin son:   Tonye Becket en agua sucia.  Humedad que queda en el odo despus de nadar o baarse.  Lesin leve en la oreja (traumatismo).  Objetos atascados en el odo (cuerpo extrao).  Cortes o raspones (abrasiones) en la parte exterior de la [place]. SNTOMAS  En general, la primer sntoma de infeccin es la picazn en el canal auditivo. Ms tarde, los signos y las sntomas pueden ser hinchazn y enrojecimiento del conducto auditivo, dolor de odo, y supuracin de lquido de color blanco amarillento (pus). El Social research officer, government de odo puede empeorar cuando tira el lbulo de la [place].  DIAGNSTICO  El [employer] har un examen fsico. Podr tomar Truddie Coco de lquido de la oreja y Hydrographic surveyor bacterias u hongos.  TRATAMIENTO  Las gotas antibiticas para los odos se administran generalmente entre 10 a 14 das. El tratamiento tambin puede ser analgsicos o corticoides para reducir la comezn y la hinchazn.  PREVENCIN   Mantenga el odo seco. Use la punta de una toalla para absorber el agua del canal auditivo despus de nadar o del bao.  Evite rascarse o poner objetos en el interior del odo. Esto puede daar el conducto auditivo externo o eliminar la cera protectora que recubre el conducto. Esto facilita el crecimiento de las bacterias y hongos.  Evite [employer], en agua contaminada, o en las piscinas mal cloradas.  Puede usar las gotas para los odos hechas de alcohol y vinagre  despus de Social worker. Mezcle en partes iguales el vinagre blanco y el alcohol en una botella. Ponga 3 o 4 gotas en cada odo despus de nadar. INSTRUCCIONES PARA EL CUIDADO EN EL HOGAR   Aplique gotas de antibitico en el conducto auditivo segn lo indicado por su mdico.  Slo tome medicamentos de venta libre o recetados para Glass blower/designer, las molestias o bajar la fiebre segn las indicaciones de su mdico.  Si tiene diabetes, siga las instrucciones adicionales de [place].  Cumpla con todas las visitas de control, segn le indique su mdico. SOLICITE ATENCIN MDICA SI:   Jaclynn Guarneri.  Su odo contina rojo, hinchado, le duele o supura pus despus de 3 das.  El dolor, la hinchazn o el enrojecimiento empeoran.  Sufre un dolor intenso de [place].  Tiene en la zona detrs de la oreja que est roja, hinchada, le duele o est sensible. ASEGRESE DE QUE:   Comprende estas instrucciones.  Controlar su enfermedad.  Solicitar ayuda de inmediato si no mejora o si empeora. Document Released: 12/04/2005 Document Revised: 02/26/2012 [facility] Patient Information 2015 [place]. This information is not intended to replace advice given to you by your health care provider. Make sure you discuss any questions you have with your health care provider.

## 2014-06-05 ENCOUNTER — Other Ambulatory Visit: Payer: Self-pay | Admitting: *Deleted

## 2014-06-05 MED ORDER — NEOMYCIN-POLYMYXIN-HC 1 % OT SOLN: 3.0000 [drp] | Freq: Four times a day (QID) | OTIC | Status: DC

## 2014-06-07 MED ORDER — ALBUTEROL SULFATE HFA 108 (90 BASE) MCG/ACT IN AERS: 2.0000 | INHALATION_SPRAY | RESPIRATORY_TRACT | Status: DC | PRN

## 2014-06-07 NOTE — Progress Notes (Signed)
Garret Reddish, MD Phone: [phone]  Subjective:   Julie [name] is a 33 y.o. year old very pleasant female patient who presents with the following:  Mid back pain and mild shortness of breath (ED follow up) Seen about a month ago for difficulty taking deep breaths due to pain in her upper back. She went to the ED 2 days later due to worsening of the SOB as well as left arm weakness (which resolved during time in ED) . She had a Ct of her head which was negative. SHe did have some wheeze on exam at that time and was diagnosed with bronchitis. She was given albuterol inhaler and a course of prednisone. She states her shortness of breath resolved with this. SHe does intermittently have some mild mid back pain but nothing like she previously experienced. Does continue to have a mild cough but states she got some red bumps from the prednisone so does not want to repeat.   ROS-no fecal or urinary incontinence or leg weakness or saddle anesthesia. No chest pain. No recent travel,leg swelling, history of DVT, hemoptysis. Not on birth control  Bilateral ear pain Location- both ears Quality- aching pain Severity- moderate Duration- x 1 week Timing- denies injury or q tip use, states has been mildly worsening Context- denies swimming in a pool, denies injury Modifying factors- has not tried anything, worse with pushing on tragus Associated symptoms- ear fullness, some mild changes in hearing  ROS- no rash, no fever/chills. Denies pain with chewing or clicking or popping of jaw.   Past Medical History- Patient Active Problem List   Diagnosis Date Noted  . Pre-diabetes 12/03/2013    Priority: Medium  . NASH (nonalcoholic steatohepatitis) 02/25/2013    Priority: Medium  . Depression 05/11/2012    Priority: Medium  . Hyperlipidemia LDL goal < 160 04/29/2012    Priority: Medium  . 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 Obesity, Class II, BMI 35-39.9 01/22/2012   Priority: Medium  . Periumbilical hernia 20/94/7096    Priority: Low  . Enlargement of lymph nodes 03/03/2013    Priority: Low  . Rash 10/07/2012    Priority: Low  . Osteoarthritis 07/23/2012    Priority: Low  . Lipoma of lower extremity 02/27/2012    Priority: Low  . Mid back pain (pleuritic) 04/28/2014  . Knee meniscus pain 09/09/2013   Medications- antivert and metformin (prediabetes) and  on med list but patient denies taking any medications currently.    Objective: BP 107/76  Pulse 80  Temp(Src) 98.2 F (36.8 C) (Oral)  Wt 223 lb (101.152 kg) Gen: NAD, resting comfortably on table CV: RRR no murmurs rubs or gallops Ears, pain with pulling on ear or pushing on tragus. Bilateral ear canals with intense erythema noted including some extension onto bilateral TM, no wax or debris Lungs: CTAB bilaterally  Ext: no edema, equal size bilaterally at 10 cm below tibial plateau Skin: warm, dry Neuro: grossly normal, moves all extremities  Assessment/Plan:  Mid back pain (pleuritic) SOB worsened and went to ED and has not had follow up in our clinic. Diagnosed with bronchitis and given course of prednisone and albuterol with improvement. Patient still with mild cough and states albuterol helps so requests refill at this time. Seems to have post-viral cough and would consider prednisone but resistant because she seemed to get a rash on the medication. Follow up prn  Bilateral otitis externa  Bilateral otitis externa atypical and no recent swimming but  this still appears to be bilateral otitis externa on history and exam. Will treat with ciprodex (appears was changed to cortisporin as not carried at HD). Follow up if no improvement or if worsens.   Meds ordered this encounter  Medications  . DISCONTD: ciprofloxacin-dexamethasone (CIPRODEX) otic suspension    Sig: Place 4 drops into both ears 2 (two) times daily. Generic please.    Dispense:  7.5 mL    Refill:  0  . albuterol  (PROVENTIL HFA;VENTOLIN HFA) 108 (90 BASE) MCG/ACT inhaler    Sig: Inhale 2 puffs into the lungs every 4 (four) hours as needed for wheezing or shortness of breath.    Dispense:  1 Inhaler    Refill:  0    Order Specific Question:  Supervising Provider    Answer:  Noemi Chapel D [[id number]]

## 2014-06-07 NOTE — Assessment & Plan Note (Addendum)
SOB worsened and went to ED and has not had follow up in our clinic. Diagnosed with bronchitis and given course of prednisone and albuterol with improvement. Patient still with mild cough and states albuterol helps so requests refill at this time. Seems to have post-viral cough and would consider prednisone but resistant because she seemed to get a rash on the medication. Follow up prn

## 2014-06-16 ENCOUNTER — Ambulatory Visit (INDEPENDENT_AMBULATORY_CARE_PROVIDER_SITE_OTHER): Payer: No Typology Code available for payment source | Admitting: Family Medicine

## 2014-06-16 ENCOUNTER — Encounter: Payer: Self-pay | Admitting: Family Medicine

## 2014-06-16 VITALS — BP 112/75 | HR 78 | Temp 98.1°F | Ht 66.0 in | Wt 220.0 lb

## 2014-06-16 DIAGNOSIS — R053 Chronic cough: Secondary | ICD-10-CM

## 2014-06-16 DIAGNOSIS — H9190 Unspecified hearing loss, unspecified ear: Secondary | ICD-10-CM

## 2014-06-16 DIAGNOSIS — R059 Cough, unspecified: Secondary | ICD-10-CM

## 2014-06-16 DIAGNOSIS — H9193 Unspecified hearing loss, bilateral: Secondary | ICD-10-CM

## 2014-06-16 DIAGNOSIS — R05 Cough: Secondary | ICD-10-CM

## 2014-06-16 MED ORDER — FLUTICASONE PROPIONATE 50 MCG/ACT NA SUSP: 2.0000 | Freq: Every day | NASAL | Status: DC

## 2014-06-16 NOTE — Patient Instructions (Signed)
Nice to see you. We will try a nasal spray for your congestion and cough. We will send you to audiology to have your hearing evaluated further.

## 2014-06-17 DIAGNOSIS — R053 Chronic cough: Secondary | ICD-10-CM | POA: Insufficient documentation

## 2014-06-17 DIAGNOSIS — H9190 Unspecified hearing loss, unspecified ear: Secondary | ICD-10-CM | POA: Insufficient documentation

## 2014-06-17 DIAGNOSIS — R05 Cough: Secondary | ICD-10-CM | POA: Insufficient documentation

## 2014-06-17 NOTE — Progress Notes (Signed)
Patient ID: Julie [name], female   DOB: [birth year], 33 y.o.   MRN: [id number]  Tommi Rumps, MD Phone: [phone]  Julie [name] is a 33 y.o. female who presents today for same day appointment.  Chronic cough: On review of chart and in talking to the patient she has recently been seen for a cough. The cough has been going on for 2 months. She has felt that there is a "noise in my chest." She does note she had an episode of shortness of breath in May and went to the ED. She was noted to be wheezing at that time and was given a neb and improved. She was sent home on albuterol and prednisone. She states this did not help much. She is still using the albuterol every 4 hours. Her work-up otherwise was normal. At this time she denies any shortness of breath and notes that her hearing is the issue bothering her the most.   Hearing loss: She states this has been going on for 2 weeks. She has had a productive cough with this. She has had congestion with this. She denies fevers, night sweats, travel outside the [place] in the past 15 years. She does not smoke. She denies reflux symptoms. She does note a pinch in her left mid back with deep inspiration that has improved since this was previously evaluated.  Patient is a nonsmoker.   ROS: Per HPI   Physical Exam Filed Vitals:   06/16/14 1625  BP: 112/75  Pulse: 78  Temp: 98.1 F (36.7 C)    Gen: Well NAD HEENT: PERRL,  MMM, no OP erythema, bilateral TMs with clear fluid behind them, no LAD Lungs: CTABL Nl WOB, no wheezing Heart: RRR no MRG MSK: no tenderness to palpation of thoracic and lumbar spinal regions Exts: Non edematous BL  LE, warm and well perfused.   Hearing screen: R ear 40 dB L ear failed  Assessment/Plan: Please see individual problem list.  # Healthcare maintenance: not addressed

## 2014-06-17 NOTE — Assessment & Plan Note (Signed)
Patient with cough for the past 2 months. Previously treated with albuterol and prednisone given previous wheezing though patient does not feel this helped and is currently without wheezing and SOB. Denies symptoms of reflux. She does not endorse any recent travel. She is not on any medications that could contribute. She does have symptoms of allergic rhinitis and chronic congestion which may be contributing to this issue. Will start with a trial of flonase and continue zyrtec. If this does not improve things will need to consider repeat CXR, PPD, trial of PPI, and PFTs. F/u with PCP in 2 weeks.

## 2014-06-17 NOTE — Assessment & Plan Note (Signed)
Patient with loss of hearing in bilateral ears L>R on screening. Potentially related to fluid in middle ear relating to uncontrolled congestion. Will refer to audiology for further evaluation and then likely to ENT after that. Note patient appeared to hear well during our conversation.

## 2014-06-22 ENCOUNTER — Encounter: Payer: No Typology Code available for payment source | Admitting: Family Medicine

## 2014-07-22 ENCOUNTER — Ambulatory Visit (INDEPENDENT_AMBULATORY_CARE_PROVIDER_SITE_OTHER): Payer: No Typology Code available for payment source | Admitting: Family Medicine

## 2014-07-22 ENCOUNTER — Encounter: Payer: Self-pay | Admitting: Family Medicine

## 2014-07-22 VITALS — BP 112/75 | HR 84 | Temp 98.4°F | Wt 225.0 lb

## 2014-07-22 DIAGNOSIS — R7303 Prediabetes: Secondary | ICD-10-CM

## 2014-07-22 DIAGNOSIS — R7309 Other abnormal glucose: Secondary | ICD-10-CM

## 2014-07-22 DIAGNOSIS — R21 Rash and other nonspecific skin eruption: Secondary | ICD-10-CM

## 2014-07-22 LAB — CBC
HEMATOCRIT: 39.9 % (ref 36.0–46.0)
Hemoglobin: 13.5 g/dL (ref 12.0–15.0)
MCH: 30.2 pg (ref 26.0–34.0)
MCHC: 33.8 g/dL (ref 30.0–36.0)
MCV: 89.3 fL (ref 78.0–100.0)
Platelets: 190 10*3/uL (ref 150–400)
RBC: 4.47 MIL/uL (ref 3.87–5.11)
RDW: 13 % (ref 11.5–15.5)
WBC: 5.8 10*3/uL (ref 4.0–10.5)

## 2014-07-22 LAB — POCT GLYCOSYLATED HEMOGLOBIN (HGB A1C): Hemoglobin A1C: 7.8

## 2014-07-22 MED ORDER — NYSTATIN 100000 UNIT/GM EX POWD: CUTANEOUS | Status: DC

## 2014-07-22 MED ORDER — METFORMIN HCL 850 MG PO TABS: 850.0000 mg | ORAL_TABLET | Freq: Every day | ORAL | Status: DC

## 2014-07-22 NOTE — Assessment & Plan Note (Addendum)
A1c 6.2 in 11/2013. Will check A1C again today and start metformin.

## 2014-07-22 NOTE — Assessment & Plan Note (Addendum)
Rash consistent with acanthosis nigricans. Unclear why rash is pruritic. Given that the patient has failed numerous topical steroid creams, we will proceed with nystatin powder at this time to treat for any potential fungal component. No active areas that would be high yield for scraping or biopsy. Can consider punch biopsy if not improving.

## 2014-07-22 NOTE — Addendum Note (Signed)
Addended by: [place] Pink on: 07/22/2014 05:02 PM   Modules accepted: Orders
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
## 2014-07-22 NOTE — Patient Instructions (Signed)
Thank you for coming to clinic today. Your rash is likely due to high blood sugar. We will check your blood sugar today. We also started a powder that you can use 2-3 times a day. We also started you on a diabetic medication called metformin that will help with your blood sugar and hopefully your rash also.

## 2014-07-23 ENCOUNTER — Encounter: Payer: Self-pay | Admitting: Family Medicine

## 2014-07-23 LAB — BASIC METABOLIC PANEL
BUN: 9 mg/dL (ref 6–23)
CALCIUM: 9.2 mg/dL (ref 8.4–10.5)
CHLORIDE: 100 meq/L (ref 96–112)
CO2: 22 meq/L (ref 19–32)
Creat: 0.59 mg/dL (ref 0.50–1.10)
Glucose, Bld: 274 mg/dL — ABNORMAL HIGH (ref 70–99)
Potassium: 3.9 mEq/L (ref 3.5–5.3)
Sodium: 136 mEq/L (ref 135–145)

## 2014-07-24 ENCOUNTER — Telehealth: Payer: Self-pay | Admitting: Family Medicine

## 2014-07-24 NOTE — Telephone Encounter (Signed)
We can possibly do a reminder for this and patient can schedule closer to time since schedule isn't open that far in advance. Ayelen Sciortino,CMA

## 2014-07-24 NOTE — Telephone Encounter (Signed)
Called patient re: lab results and new diagnosis of diabetes. Patient started on metformin. Will recheck labs in 3 months.

## 2014-08-13 ENCOUNTER — Inpatient Hospital Stay (HOSPITAL_COMMUNITY)
Admission: RE | Admit: 2014-08-13 | Discharge: 2014-08-13 | Disposition: A | Discharge status: Home / Self Care | Payer: No Typology Code available for payment source | Source: Ambulatory Visit

## 2014-08-13 ENCOUNTER — Ambulatory Visit (HOSPITAL_COMMUNITY)
Admission: RE | Admit: 2014-08-13 | Discharge: 2014-08-13 | Disposition: A | Discharge status: Home / Self Care | Payer: No Typology Code available for payment source | Source: Ambulatory Visit | Attending: Family Medicine | Admitting: Family Medicine

## 2014-08-13 ENCOUNTER — Ambulatory Visit (INDEPENDENT_AMBULATORY_CARE_PROVIDER_SITE_OTHER): Payer: No Typology Code available for payment source | Admitting: Family Medicine

## 2014-08-13 ENCOUNTER — Encounter: Payer: Self-pay | Admitting: Family Medicine

## 2014-08-13 VITALS — BP 110/70 | HR 66 | Temp 98.1°F | Wt 222.0 lb

## 2014-08-13 DIAGNOSIS — R0989 Other specified symptoms and signs involving the circulatory and respiratory systems: Secondary | ICD-10-CM

## 2014-08-13 DIAGNOSIS — R0609 Other forms of dyspnea: Secondary | ICD-10-CM

## 2014-08-13 DIAGNOSIS — R0600 Dyspnea, unspecified: Secondary | ICD-10-CM

## 2014-08-13 MED ORDER — LEVALBUTEROL TARTRATE 45 MCG/ACT IN AERO: 2.0000 | INHALATION_SPRAY | RESPIRATORY_TRACT | Status: DC | PRN

## 2014-08-13 MED ORDER — PREDNISONE 50 MG PO TABS: 50.0000 mg | ORAL_TABLET | Freq: Every day | ORAL | Status: DC

## 2014-08-13 MED ORDER — PREDNISONE 20 MG PO TABS: 20.0000 mg | ORAL_TABLET | Freq: Every day | ORAL | Status: DC

## 2014-08-13 MED ORDER — OMEPRAZOLE 20 MG PO CPDR: 20.0000 mg | DELAYED_RELEASE_CAPSULE | Freq: Every day | ORAL | Status: DC

## 2014-08-13 MED ORDER — MOMETASONE FUROATE 50 MCG/ACT NA SUSP: NASAL | Status: DC

## 2014-08-13 MED ORDER — PREDNISONE 20 MG PO TABS
40.0000 mg | ORAL_TABLET | Freq: Once | ORAL | Status: AC
  Administered 2014-08-13: 40 mg via ORAL

## 2014-08-13 NOTE — Progress Notes (Addendum)
Patient ID: Julie [name], female   DOB: [birth year], 34 y.o.   MRN: [id number] Subjective:   CC: cough  HPI:   Cough Patient is a 33 y.o. female with h/o chronic cough, obesity, and NASH here with shortness of breath and cough. She reports symptoms have been present for 1 week and also include substernal chest pain that feels "like a punch" and moves into her back. She denies fevers, chills, rhinorrhea, sneeze, bloody sputum, or symptoms of gerd (burning or burping). She reports dry cough, chest congestion, some dizziness after long episode of cough, and metallic taste when she coughs. She had similar symptoms 2 months ago and was seen in the ED, put on prednisone with significant wheezing, and sent home. Symptoms improved but are back. Nothing has made them better but she has not tried medication. She denies sick contacts. She denies PND or orthopnea but is unable to lay flat due to back pain. Not taking zyrtec or flonase (flonase too costly).   Review of Systems - Per HPI.   PMH: No h/o clot DM Allergic to albuterol - hives  Smoking status: No exposure to tobacco Does not work now; no exposure to chemicals/dust particulate    Objective:  Physical Exam BP 110/70  Pulse 66  Temp(Src) 98.1 F (36.7 C) (Oral)  Wt 222 lb (100.699 kg)  PF 140 L/min GEN: NAD CV: RRR, no m/r/g PULM: Wheezing throughout, normal effort, but occasional dry-sounding cough CHEST: TTP over sternum HEENT: AT/[place], sclera clear, o/p mildly erythematous, no purulence, no tenderness over maxillary or frontal sinuses, nasal congestion heard    Assessment:     Julie [name] is a 33 y.o. female with h/o reactive airways seen in ED 2 months ago here for similar symptoms.    Plan:     Cough Likely undiagnosed asthma currently in exacerbation with URI given hx and exam. Not taking anything at home. EKG stable from prior, with T inversion in III. Chest pain likely chest wall tenderness from coughing.  Afebrile with normal vitals in the office.  Peak flow 130, 140, 140 L/min today.  - Xopenex (q 4 hours scheduled x 2 days, then prn) due to albuterol reported allergy. Added to allergies list. - Prednisone x 1 in the office, followed by 4 days of prednisone. - Start trial omeprazole 20mg  daily and do not take NSAIDs for now. - CXR - Asked pt to f/ in 1 week. At that time, also provide PPD and recheck peak flow. - Take zyrtec and nasal steroid daily. Switched to nasonex to see if covered/more affordable for pt. - Pt so schedule appt for PFT with Dr Valentina Lucks. - Return precautions reviewed. - Precepted with Dr Gwendlyn Deutscher.   Follow-up: Follow up in 1 week. - Needs appt with PCP    Hilton Sinclair, MD [place]

## 2014-08-13 NOTE — Patient Instructions (Signed)
This is likely an exacerbation of asthma. Take prednisone daily for 5 days. For the next two days, take xopenex every 4 hours and then decrease to as needed. Stay away from any allergens. Take zyrtec and nasonex daily to keep allergies low. Take omeprazole 20mg  daily and do not take any NSAIDs like ibuprofen, naproxen, or aspirin. Follow up in 1 week. At that time you can get a PPD. Schedule an appointment with Dr Valentina Lucks for pulmonary function testing. In the meantime, if you have trouble breathing that is worse, dizziness, fainting, chest pain that worsens, or other concerns, seek immediate care.  Julie Sinclair, MD

## 2014-08-17 NOTE — Assessment & Plan Note (Signed)
Likely undiagnosed asthma currently in exacerbation with URI given hx and exam. Not taking anything at home. EKG stable from prior, with T inversion in III. Chest pain likely chest wall tenderness from coughing. Afebrile with normal vitals in the office.  Peak flow 130, 140, 140 L/min today.  - Xopenex (q 4 hours scheduled x 2 days, then prn) due to albuterol reported allergy. Added to allergies list. - Prednisone x 1 in the office, followed by 4 days of prednisone. - Start trial omeprazole 20mg  daily and do not take NSAIDs for now. - CXR - Asked pt to f/ in 1 week. At that time, also provide PPD and recheck peak flow. - Take zyrtec and nasal steroid daily. Switched to nasonex to see if covered/more affordable for pt. - Pt so schedule appt for PFT with Dr Valentina Lucks. - Return precautions reviewed. - Precepted with Dr Gwendlyn Deutscher.

## 2014-08-19 ENCOUNTER — Other Ambulatory Visit (HOSPITAL_COMMUNITY): Payer: No Typology Code available for payment source

## 2014-08-21 ENCOUNTER — Ambulatory Visit: Payer: No Typology Code available for payment source | Admitting: Family Medicine

## 2014-08-26 ENCOUNTER — Ambulatory Visit: Payer: No Typology Code available for payment source | Admitting: Family Medicine

## 2014-08-26 ENCOUNTER — Ambulatory Visit (INDEPENDENT_AMBULATORY_CARE_PROVIDER_SITE_OTHER): Payer: No Typology Code available for payment source | Admitting: Family Medicine

## 2014-08-26 VITALS — BP 132/82 | HR 74 | Temp 98.3°F | Wt 220.0 lb

## 2014-08-26 DIAGNOSIS — R519 Headache, unspecified: Secondary | ICD-10-CM | POA: Insufficient documentation

## 2014-08-26 DIAGNOSIS — R51 Headache: Secondary | ICD-10-CM

## 2014-08-26 DIAGNOSIS — E119 Type 2 diabetes mellitus without complications: Secondary | ICD-10-CM

## 2014-08-26 NOTE — Assessment & Plan Note (Signed)
Went over lab results from last visit, explained what A1c was and mechanisms of underlying disease. At this time advised her to cut her metformin in half to see if this improves her symptoms.

## 2014-08-26 NOTE — Assessment & Plan Note (Signed)
Patient attributing to start of metformin. Headache description somewhat vague through use of interpreter, patient may be describing migraine vs cluster headaches. Facial numbness could be sequela of either. Given her age it would be unusual presentation of TIA, would favor it to be due to headache as the rest of her neuro exam is normal. Plan: asked patient to cut metformin in half and take for 1 week, if still having symptoms stop for 1 week and follow up in clinic. Advised to continue OTC pain medications as needed

## 2014-08-26 NOTE — Patient Instructions (Addendum)
It would be unusual for the metformin to be causing your headaches, but try cutting the tablets in half and take one a day. If you are still getting headaches each time you take the metformin, stop for 1 week and if symptoms have completely resolved then the medication may be the issue.  Please record on a sheet of paper when you are getting your headaches, what symptoms you are having, and what you take to help.  Come back in 2 weeks to see either Dr. Jerline Pain or myself.

## 2014-08-26 NOTE — Progress Notes (Signed)
Patient ID: Julie [name], female   DOB: 1981/03/11, 33 y.o.   MRN: [id number]   Subjective:    Patient ID: Julie [name], female    DOB: [birth year], 33 y.o.   MRN: [id number]  HPI  Phone interpreter used during this Same Day Visit  CC: Headache  # Headache:  Headaches since starting metformin 2 weeks ago  States she has improved symptoms and no headache today as she has not taken the medication in 2 days  Headaches can last most of the day  Has associated numbness of her nose and left side of face that is only present "at times"  No associated nausea, photo/phonophobia  Does have associated runny nose, tearing at times  Has taken advil which helps some  Review of Systems   See HPI for ROS. All other systems reviewed and are negative.  Past medical history, surgical, family, and social history reviewed and updated in the EMR. No new updates were made today. Objective:  BP 132/82  Pulse 74  Temp(Src) 98.3 F (36.8 C) (Oral)  Wt 220 lb (99.791 kg) Vitals reviewed  General: NAD HEENT: PERRL, EOMI. CV: RRR, normal heart sounds, no murmurs Resp: CTAB, normal effort Neuro: alert and oriented, CN2-12 normal except for decreased sensation on left face (forehead, cheek, lower jaw). Strength 5/5 grip, UE, LE.   Assessment & Plan:  See Problem List Documentation

## 2014-09-15 ENCOUNTER — Ambulatory Visit: Payer: No Typology Code available for payment source | Admitting: Family Medicine

## 2014-09-21 ENCOUNTER — Ambulatory Visit: Payer: No Typology Code available for payment source

## 2014-10-19 ENCOUNTER — Encounter: Payer: Self-pay | Admitting: Family Medicine

## 2014-10-22 ENCOUNTER — Telehealth: Payer: Self-pay | Admitting: *Deleted

## 2014-10-22 NOTE — Telephone Encounter (Signed)
Pt wants to know if MD is willing to call in a refill on her ear drops because she is having some pain and ear fullness. [name], Julie [name]

## 2014-10-22 NOTE — Telephone Encounter (Signed)
I would like to have her evaluated in clinic before prescribing ear drops.  Julie [name]. Jerline Pain, [place] Resident PGY-1 10/22/2014 11:57 AM

## 2014-10-22 NOTE — Telephone Encounter (Signed)
Same day appt made for 10/23/2014.  Julie [name], Julie [name]

## 2014-10-23 ENCOUNTER — Ambulatory Visit: Payer: No Typology Code available for payment source | Admitting: Family Medicine

## 2014-11-02 ENCOUNTER — Encounter: Payer: Self-pay | Admitting: Family Medicine

## 2014-11-02 ENCOUNTER — Ambulatory Visit (INDEPENDENT_AMBULATORY_CARE_PROVIDER_SITE_OTHER): Payer: No Typology Code available for payment source | Admitting: Family Medicine

## 2014-11-02 VITALS — BP 108/68 | HR 72 | Temp 98.2°F | Resp 20 | Wt 217.0 lb

## 2014-11-02 DIAGNOSIS — R0600 Dyspnea, unspecified: Secondary | ICD-10-CM

## 2014-11-02 MED ORDER — NEOMYCIN-POLYMYXIN-HC 1 % OT SOLN
3.0000 [drp] | Freq: Four times a day (QID) | OTIC | Status: DC
Start: 2014-11-02 — End: 2014-11-05

## 2014-11-02 MED ORDER — IPRATROPIUM BROMIDE HFA 17 MCG/ACT IN AERS: 2.0000 | INHALATION_SPRAY | Freq: Four times a day (QID) | RESPIRATORY_TRACT | Status: DC | PRN

## 2014-11-02 NOTE — Progress Notes (Signed)
   Subjective:    Patient ID: Julie [name], female    DOB: 08-Feb-1981, 33 y.o.   MRN: [id number]  CC: SOB  HPI 33 year old Hispanic female with DM-2, Obesity, and HLD presents for a same day appointment with complaints of SOB.  1) SOB  Patient reports that she has been experiencing SOB for several months (since ED visit in May).  She was seen for this in Aug 2015 by Dr. Jacklynn Bue; She felt that this was likely asthma.  Patient reports that her SOB has been worsening recently.  She reports SOB w/ rest and with exertion.  Associated cough w/ clear sputum production.  She also reports associated wheezing.   She reports improvement with prior Prednisone bursts.  She states that she is allergic to Albuterol (rash).   No known exacerbating or relieving factors (other than Prednisone).  No recent travel, leg edema, PND, Orthopnea.   Social Hx - Nonsmoker.  Review of Systems Per HPI with the following additions: General - No fever, chills. CV - + Chest pain w/ cough.  Pulm - No hemoptysis.    Objective:   Physical Exam Filed Vitals:   11/02/14 1401  BP: 108/68  Pulse: 72  Temp: 98.2 F (36.8 C)  Resp: 20   Exam: General: well appearing obese female in NAD.  HEENT: NCAT. Normal TM's. Oropharynx clear.  Neck: No adenpopathy. Cardiovascular: RRR. No murmurs, rubs, or gallops. Respiratory: CTAB. No rales, rhonchi, or wheeze. Extremities: No LE edema. Skin: No rash.      Assessment & Plan:  See Problem List

## 2014-11-02 NOTE — Patient Instructions (Signed)
It was nice to see you today.  Your SOB is likely secondary to asthma.  I have scheduled you for PFT's (at our office w/ Dr. Valentina Lucks) at 2 pm on Thursday (11/19).  Use the Atrovent (inhaler as indicated).  We will know more and treat your accordingly after the PFT's.  Take care,  Dr. Lacinda Axon

## 2014-11-02 NOTE — Assessment & Plan Note (Signed)
Unclear etiology. DDX - CHF (unlikely as no PND, Orthopnea; No LE edema), Sarcoidosis (unlikely as prior xray with no adenpathy, hispanic descent), Asthma (could be the culprit but patient with no prior childhood hx of RAD/Asthma), COPD (unlikley as patient is a nonsmoker), Restrictive lung disease, SLE/Other immunologic cause (unlikely given no joint pain, rash, kidney issues). Patient well appearing and not hypoxic today.  Will treat patient with Atrovent as I feel that Asthma is the most likely diagnosis and patient has Albuterol allergy. Appt with Dr. Valentina Lucks arranged today for PFT's. If negative for obstruction/restrictive lung disease will need to pursue further workup - Echo, possible ACE level, immunologic work up, etc.

## 2014-11-05 ENCOUNTER — Encounter: Payer: Self-pay | Admitting: Pharmacist

## 2014-11-05 ENCOUNTER — Ambulatory Visit (INDEPENDENT_AMBULATORY_CARE_PROVIDER_SITE_OTHER): Payer: No Typology Code available for payment source | Admitting: Pharmacist

## 2014-11-05 ENCOUNTER — Other Ambulatory Visit: Payer: Self-pay | Admitting: *Deleted

## 2014-11-05 VITALS — BP 110/75 | HR 78 | Ht 66.0 in | Wt 218.7 lb

## 2014-11-05 DIAGNOSIS — R0600 Dyspnea, unspecified: Secondary | ICD-10-CM

## 2014-11-05 DIAGNOSIS — R053 Chronic cough: Secondary | ICD-10-CM

## 2014-11-05 DIAGNOSIS — R05 Cough: Secondary | ICD-10-CM

## 2014-11-05 MED ORDER — NEOMYCIN-POLYMYXIN-HC 1 % OT SOLN: 3.0000 [drp] | Freq: Four times a day (QID) | OTIC | Status: DC

## 2014-11-05 NOTE — Patient Instructions (Addendum)
Thank you for visiting [place]!  Today we performed a pulmonary function test.  This test resulted in normal lung function, however, I understand you are still having symptoms.  The next step would be to get a chest x-ray to further investigate for possible causes of your problems.  Please follow up with Dr. Jerline Pain within 2 weeks.    To help with your symptoms, you may try guaifenesin with dextromethorphan to help with your cough and sleep.

## 2014-11-05 NOTE — Progress Notes (Signed)
S:    Patient arrives in good spirits however she is concerned about her shortness of breath.  She presents for lung function evaluation. Patient well appearing and not hypoxic or in acute distress today.   Patient reports worsened breathing began after a "bronchitis" she had in July.  She states her breathing has been not improving and her shortness of occurs constantly throughout the day.  She complains of a dry hacking cough that causes her to wake up from her sleep.  If she exercises or climbs a flight of stairs, she becomes short of breath and states her heart races.   Patient does report snoring at night, and she has been told she skips breaths at night.  She also states she does not sleep well at night.    She was previously treated for bronchitis in July, in which prednisone appeared to help.  However, her blood sugars began to rise and therefore the steroids were stopped.  She has tried many OTC medications in the [place] store without improvement, including "Nyquil."  She was prescribed albuterol but had a reaction of hives (allergic reaction).  She cannot afford Xopenex or Spiriva.   O:  See "scanned report" or Documentation Flowsheet (discrete results - PFTs) for  Spirometry results. Patient provided good effort while attempting spirometry.   FEV 1%: 99%, 98%,100% FVC 3.34, 3.19, 2.79 Oxygen saturation was maintained  >97% and maximal HR 99 during her exercise.    A/P: Spirometry evaluation with reveals normal lung function.  Cough and dyspnea of unclear etiology.  Patient was short of breath and fatigued after three laps around the clinic. Oxygen saturation was maintained  >97% and maximal HR 99 during her exercise.   Patient has been experiencing dyspnea for several months and not taking any medications for her breathing at this time.    Suggested initiation of expectorant with cough suppressant trial with no other respiratory medication addition at this time due to unclear etiology  of dyspnea.  The next step for her is to obtain a CXR and follow up clinic visit with Dr. Jerline Pain within the next 2 weeks.  Reviewed results of pulmonary function tests.  Pt verbalized understanding of results and education.  Discussed case with Dr. Mingo Amber. Written pt instructions provided.  Total time in face to face counseling 20 minutes.  Patient seen with Hassie Bruce, Pharm D.

## 2014-11-05 NOTE — Assessment & Plan Note (Signed)
Spirometry evaluation with reveals normal lung function.  Cough and dyspnea of unclear etiology.  Patient was short of breath and fatigued after three laps around the clinic. Oxygen saturation was maintained  >97% and maximal HR 99 during her exercise.   Patient has been experiencing dyspnea for several months and not taking any medications for her breathing at this time.    Suggested initiation of expectorant with cough suppressant trial with no other respiratory medication addition at this time due to unclear etiology of dyspnea.  The next step for her is to obtain a CXR and follow up clinic visit with Dr. Jerline Pain within the next 2 weeks.  Reviewed results of pulmonary function tests.  Pt verbalized understanding of results and education.  Discussed case with Dr. Mingo Amber. Written pt instructions provided.  Total time in face to face counseling 20 minutes.  Patient seen with Hassie Bruce, Pharm D.

## 2014-11-05 NOTE — Telephone Encounter (Signed)
Pt states that she uses [facility] and medication was ordered to be phoned in.  Resent medication for patient today. Jazmin Hartsell,CMA

## 2014-11-06 NOTE — Progress Notes (Signed)
Patient ID: Julie [name], female   DOB: [birth year], 34 y.o.   MRN: [id number] Reviewed: Agree with Dr. Graylin Shiver documentation and management.

## 2014-11-16 ENCOUNTER — Ambulatory Visit: Payer: No Typology Code available for payment source | Admitting: Family Medicine

## 2014-11-25 ENCOUNTER — Encounter: Payer: Self-pay | Admitting: Family Medicine

## 2014-11-25 ENCOUNTER — Ambulatory Visit (INDEPENDENT_AMBULATORY_CARE_PROVIDER_SITE_OTHER): Payer: No Typology Code available for payment source | Admitting: Family Medicine

## 2014-11-25 VITALS — BP 120/84 | HR 89 | Temp 98.1°F | Ht 66.0 in | Wt 216.0 lb

## 2014-11-25 DIAGNOSIS — R059 Cough, unspecified: Secondary | ICD-10-CM

## 2014-11-25 DIAGNOSIS — E119 Type 2 diabetes mellitus without complications: Secondary | ICD-10-CM

## 2014-11-25 DIAGNOSIS — R05 Cough: Secondary | ICD-10-CM

## 2014-11-25 DIAGNOSIS — R053 Chronic cough: Secondary | ICD-10-CM

## 2014-11-25 DIAGNOSIS — R21 Rash and other nonspecific skin eruption: Secondary | ICD-10-CM

## 2014-11-25 DIAGNOSIS — G44209 Tension-type headache, unspecified, not intractable: Secondary | ICD-10-CM | POA: Insufficient documentation

## 2014-11-25 LAB — POCT GLYCOSYLATED HEMOGLOBIN (HGB A1C): Hemoglobin A1C: 6.6

## 2014-11-25 MED ORDER — RANITIDINE HCL 150 MG PO TABS: 150.0000 mg | ORAL_TABLET | Freq: Two times a day (BID) | ORAL | Status: DC

## 2014-11-25 MED ORDER — FLUTICASONE PROPIONATE 50 MCG/ACT NA SUSP: 2.0000 | Freq: Every day | NASAL | Status: DC

## 2014-11-25 MED ORDER — METFORMIN HCL 850 MG PO TABS: 850.0000 mg | ORAL_TABLET | Freq: Every day | ORAL | Status: DC

## 2014-11-25 NOTE — Patient Instructions (Signed)
Thank you for coming to the clinic today. It was nice seeing you.  For your cough, we will be getting a chest xray today. We are also starting two new medications today. Ranitidine and fluticasone nasal spray. We will call you with results of the chest xray.  For your diabetes, your numbers look a lot better today. Continue taking your metformin.  For your headaches, it sounds like what is called a tension headache. It is important for you stay well hydrated and avoid as much stress as possible. I also recommend that you have an eye exam to see if you need glasses. You can take over the counter ibuprofen or tylenol. Please see the below handout.  See you again in 3 months or sooner if not getting better.  Dolor de cabeza por tensin  (Tension Headache)  Una cefalea tensional es un dolor o presin que se siente en la frente y los lados de la cabeza. Las cefaleas tensionales suelen aparecer despus de una situacin de estrs, preocupacin (ansiedad) o por sentirse triste o cado por Ambulance person (deprimido). CUIDADOS EN EL HOGAR   Slo tome los medicamentos segn le indique el mdico.  Cuando sienta dolor de cabeza acustese en un cuarto oscuro y tranquilo.  Lleve un registro diario para averiguar si ciertas cosas provocan dolores de cabeza. Por ejemplo, escriba:  Lo que usted come y bebe.  Cunto tiempo duerme.  Algn cambio en su dieta o medicamentos.  Reljese recibiendo masajes o haga otras actividades relajantes.  Coloque hielo o calor en la cabeza y el cuello como lo indique su mdico.  DISMINUYA EL NIVEL DE ESTRS  Sintase con la espalda recta. No apriete los msculos (tensione).  Si fuma, deje de hacerlo.  Beba menos alcohol.  Consuma menos o deje de tomar cafena.  Coma y haga ejercicios regularmente.  Duerma lo suficiente.  Evite usar mucha medicacin para Conservation officer, historic buildings. SOLICITE AYUDA DE INMEDIATO SI:   El dolor de [place].  Tiene fiebre.  Presenta  rigidez en el cuello.  Tiene dificultad para ver.  Sus msculos estn dbiles, o pierde el control muscular.  Pierde el equilibrio o tiene problemas para Writer.  Siente que se desvanece (dbil) o se desmaya.  Tiene malos sntomas que son diferentes a los primeros sntomas.  Tiene problemas con los medicamentos que le haya dado su mdico.  El medicamento no le hace [place].  Siente que Conservation officer, historic buildings de cabeza es diferente a los otros dolores de [place].  Tiene malestar estomacal (nuseas) o(vmitos). ASEGRESE DE QUE:   Comprende estas instrucciones.  Controlar su enfermedad.  Solicitar ayuda de inmediato si no mejora o si empeora. Document Released: 02/26/2012 [facility] Patient Information 2015 [place]. This information is not intended to replace advice given to you by your health care provider. Make sure you discuss any questions you have with your health care provider.

## 2014-11-25 NOTE — Assessment & Plan Note (Signed)
No signs or symptoms consistent with migraines. Patient with no apparent medication overuse. Will make referral to optometry, encourage good hydration and stress reduction. Also recommended OTC analgesics as needed.

## 2014-11-25 NOTE — Assessment & Plan Note (Signed)
Etiology still unclear with normal PFTs. Will start fluticasone nasal spray and ranitidine to treat possible post-nasal drip and/or GERD. Will also obtain CXR today. If no improvement and CXR unremarkable, can consider further work up including chest CT, ACE level, ANA, and possible echocardiogram.

## 2014-11-25 NOTE — Assessment & Plan Note (Signed)
A1c at goal on metformin. Will continue current therapy. Encouraged lifestyle modifications.

## 2014-11-25 NOTE — Progress Notes (Signed)
Julie [name] is a 32 y.o. female who presents to the [facility] today with a chief complaint of cough. Her concerns today include:  HPI:  Dyspnea/cough: Cannot take deep breaths. Started in March or April after being diagnosed with bronchitis. Took steroids (from friends) last week which helped some. PFTs normal. Dry cough that is worse at night. Started on atrovent, at last visit, however patient has been unable to afford it. Patient has not been taking omeprazole due to cost.  Patient has albuterol allergy (rash).   Headache. Started several months ago.  Feel like pressure on both sides of head. Headaches everyday. Headaches occur more in the evening, 10/10. Takes ibuprofen, not very often. Ibuprofen helps a little bit. Eyes feel tired. Never had eye exam before. Generalized weakness with headache. No numbness. Blurred vision associated with headaches.   Prediabetes. On metformin. No problems or side effects. No chest pain or shortness of breath. No other blurred vision.   ROS: As per HPI, otherwise all systems reviewed and are negative.  Past Medical History - Reviewed and updated Patient Active Problem List   Diagnosis Date Noted  . Tension headache 11/25/2014  . Dyspnea 08/13/2014  . Chronic cough 06/17/2014  . Mid back pain (pleuritic) 04/28/2014  . Periumbilical hernia 78/93/8101  . Diabetes mellitus type 2, uncomplicated 75/09/2584  . Knee meniscus pain 09/09/2013  . Enlargement of lymph nodes 03/03/2013  . NASH (nonalcoholic steatohepatitis) 02/25/2013  . Rash 10/07/2012  . Osteoarthritis 07/23/2012  . Depression 05/11/2012  . Hyperlipidemia LDL goal < 160 04/29/2012  . Lipoma of lower extremity 02/27/2012  . Secondary amenorrhea 01/22/2012  . Obesity, Class II, BMI 35-39.9 01/22/2012    Medications- reviewed and updated Current Outpatient Prescriptions  Medication Sig Dispense Refill  . cetirizine (ZYRTEC ALLERGY) 10 MG tablet Take 1 tablet (10 mg total) by mouth  daily. 30 tablet 1  . fluticasone (FLONASE) 50 MCG/ACT nasal spray Place 2 sprays into both nostrils daily. 16 g 2  . ipratropium (ATROVENT HFA) 17 MCG/ACT inhaler Inhale 2 puffs into the lungs every 6 (six) hours as needed for wheezing. 1 Inhaler 2  . levalbuterol (XOPENEX HFA) 45 MCG/ACT inhaler Inhale 2 puffs into the lungs every 4 (four) hours as needed for wheezing. 1 Inhaler 2  . metFORMIN (GLUCOPHAGE) 850 MG tablet Take 1 tablet (850 mg total) by mouth daily with breakfast. 30 tablet 2  . mometasone (NASONEX) 50 MCG/ACT nasal spray One spray in each nostril twice a day, use left hand for right nostril, and right hand for left nostril.  Please dispense one bottle. 1 g 6  . NEOMYCIN-POLYMYXIN-HYDROCORTISONE (CORTISPORIN) 1 % SOLN otic solution Place 3 drops into both ears 4 (four) times daily. 10 mL 0  . nystatin (MYCOSTATIN/NYSTOP) 100000 UNIT/GM POWD Apply 2-3 times daily as neeed 1 Bottle 2  . omeprazole (PRILOSEC) 20 MG capsule Take 1 capsule (20 mg total) by mouth daily. 30 capsule 3  . predniSONE (DELTASONE) 50 MG tablet Take 1 tablet (50 mg total) by mouth daily with breakfast. Starting tomorrow, for 4 more days. 4 tablet 0  . ranitidine (ZANTAC) 150 MG tablet Take 1 tablet (150 mg total) by mouth 2 (two) times daily. 60 tablet 2   No current facility-administered medications for this visit.    Objective: Physical Exam: BP 120/84 mmHg  Pulse 89  Temp(Src) 98.1 F (36.7 C) (Oral)  Ht 5\' 6"  (1.676 m)  Wt 216 lb (97.977 kg)  BMI 34.88 kg/m2  LMP  11/16/2014 (Approximate)  Gen: NAD, resting comfortably CV: RRR with no murmurs appreciated Lungs: NWOB, CTAB with no crackles, wheezes, or rhonchi Abdomen: Normal bowel sounds present. Soft, Nontender, Nondistended. Ext: no edema Skin: warm, dry Neuro: grossly normal, moves all extremities  Results for orders placed or performed in visit on 11/25/14 (from the past 72 hour(s))  POCT A1C     Status: None   Collection Time: 11/25/14   3:52 PM  Result Value Ref Range   Hemoglobin A1C 6.6     A/P: See problem list  Chronic cough Etiology still unclear with normal PFTs. Will start fluticasone nasal spray and ranitidine to treat possible post-nasal drip and/or GERD. Will also obtain CXR today. If no improvement and CXR unremarkable, can consider further work up including chest CT, ACE level, ANA, and possible echocardiogram.   Tension headache No signs or symptoms consistent with migraines. Patient with no apparent medication overuse. Will make referral to optometry, encourage good hydration and stress reduction. Also recommended OTC analgesics as needed.   Diabetes mellitus type 2, uncomplicated Q2S at goal on metformin. Will continue current therapy. Encouraged lifestyle modifications.     Orders Placed This Encounter  Procedures  . DG Chest 2 View    Standing Status: Future     Number of Occurrences:      Standing Expiration Date: 01/27/2016    Order Specific Question:  Reason for Exam (SYMPTOM  OR DIAGNOSIS REQUIRED)    Answer:  cough, shortness of breath    Order Specific Question:  Is the patient pregnant?    Answer:  No    Order Specific Question:  Preferred imaging location?    Answer:  [facility]  . Pneumococcal polysaccharide vaccine 23-valent greater than or equal to 2yo subcutaneous/IM    Standing Status: Standing     Number of Occurrences: 1     Standing Expiration Date:   . POCT A1C    Meds ordered this encounter  Medications  . ranitidine (ZANTAC) 150 MG tablet    Sig: Take 1 tablet (150 mg total) by mouth 2 (two) times daily.    Dispense:  60 tablet    Refill:  2  . metFORMIN (GLUCOPHAGE) 850 MG tablet    Sig: Take 1 tablet (850 mg total) by mouth daily with breakfast.    Dispense:  30 tablet    Refill:  2  . fluticasone (FLONASE) 50 MCG/ACT nasal spray    Sig: Place 2 sprays into both nostrils daily.    Dispense:  16 g    Refill:  2     Caleb M. Jerline Pain, [place] Resident PGY-1 11/25/2014 6:34 PM

## 2014-11-25 NOTE — Progress Notes (Signed)
Due to language barrier, an interpreter was used.  Interpreter was [facility] and his [place] interpreter ID # is [id number]. Julie [name], Salome [name]

## 2014-11-27 ENCOUNTER — Ambulatory Visit (HOSPITAL_COMMUNITY)
Admission: RE | Admit: 2014-11-27 | Discharge: 2014-11-27 | Disposition: A | Discharge status: Home / Self Care | Payer: No Typology Code available for payment source | Source: Ambulatory Visit | Attending: Family Medicine | Admitting: Family Medicine

## 2014-11-27 DIAGNOSIS — R059 Cough, unspecified: Secondary | ICD-10-CM

## 2014-11-27 DIAGNOSIS — R0602 Shortness of breath: Secondary | ICD-10-CM | POA: Insufficient documentation

## 2014-11-27 DIAGNOSIS — R05 Cough: Secondary | ICD-10-CM | POA: Insufficient documentation

## 2014-12-01 ENCOUNTER — Telehealth: Payer: Self-pay | Admitting: Family Medicine

## 2014-12-01 NOTE — Telephone Encounter (Signed)
CXR results reviewed. No significant findings. Will continue ranitidine and nasal fluticasone therapy for now and consider additional work up if not improving in 3-4 weeks.  [name]. Jerline Pain, [place] Resident PGY-1 12/01/2014 6:13 PM

## 2014-12-02 NOTE — Telephone Encounter (Signed)
LMOVM for pt to return call .Cearra [place] Dawn  

## 2015-01-04 ENCOUNTER — Ambulatory Visit (INDEPENDENT_AMBULATORY_CARE_PROVIDER_SITE_OTHER): Payer: No Typology Code available for payment source | Admitting: Family Medicine

## 2015-01-04 ENCOUNTER — Encounter: Payer: Self-pay | Admitting: Family Medicine

## 2015-01-04 VITALS — BP 104/71 | HR 76 | Temp 98.4°F | Ht 65.0 in | Wt 225.0 lb

## 2015-01-04 DIAGNOSIS — R05 Cough: Secondary | ICD-10-CM

## 2015-01-04 DIAGNOSIS — J4521 Mild intermittent asthma with (acute) exacerbation: Secondary | ICD-10-CM

## 2015-01-04 DIAGNOSIS — J4541 Moderate persistent asthma with (acute) exacerbation: Secondary | ICD-10-CM

## 2015-01-04 DIAGNOSIS — R053 Chronic cough: Secondary | ICD-10-CM

## 2015-01-04 DIAGNOSIS — J45901 Unspecified asthma with (acute) exacerbation: Secondary | ICD-10-CM | POA: Insufficient documentation

## 2015-01-04 DIAGNOSIS — R0600 Dyspnea, unspecified: Secondary | ICD-10-CM

## 2015-01-04 MED ORDER — LEVALBUTEROL TARTRATE 45 MCG/ACT IN AERO: 2.0000 | INHALATION_SPRAY | RESPIRATORY_TRACT | Status: DC | PRN

## 2015-01-04 MED ORDER — PREDNISONE 50 MG PO TABS: 50.0000 mg | ORAL_TABLET | Freq: Every day | ORAL | Status: DC

## 2015-01-04 MED ORDER — MOMETASONE FURO-FORMOTEROL FUM 200-5 MCG/ACT IN AERO: 2.0000 | INHALATION_SPRAY | Freq: Two times a day (BID) | RESPIRATORY_TRACT | Status: DC

## 2015-01-04 NOTE — Assessment & Plan Note (Signed)
Thought by today's exam and hx to be asthma with current exacerbation (see Asthma problem for full A/P).

## 2015-01-04 NOTE — Progress Notes (Signed)
Patient ID: Julie [name], female   DOB: 30-May-1981, 34 y.o.   MRN: [id number] Subjective:   CC: Cough  HPI:   Interview conducted with interpreter and in [place] by this provider.  Here for continued cough. Has h/o chronic cough with normal PFTs 11/05/14. Seen Dec for this, worse at night. Had been unable to afford atrovent, ranitidine, or omeprazole trials. Was able to pick up xopenex which she uses 6 times daily (past 2 months) and nasonex which she has taken for the past 10 days and is not helping at all with nasal congestion. CXR Dec 2015 was benign.   Since then, has tried PO steroid intermittently that she gets from a friend, which helps short term but then returns.  Subjective fever and chills intermittently since March. Has itching inside entire oropharynx. No nasal mucus, but feels nasal congestion.  Yesterday and today swelling and itching in both hands and feet. When coughs, has copper flavor in her mouth and small amount of blood occasionally. Has gained ~8 lbs since last visit. Has also been on intermittent prednisone from friends. Cannot sleep laying down. Difficulty with PND. Swelling in legs is symmetric, no h/o DVT, has pressure sensation behind both knees and visible veins behind both knees. No chest pain. Some chest pressure and difficulty breathing. Pain with coughing spells. Prednisone helps symptoms greatly. Took some a day ago from friend.   Review of Systems - Per HPI. Also wishes to discuss diabetes dx.  PMH - secondary amenorrhea, obesity class II, HLD, depression, OA, NASH, DM II, chornic cough, dyspnea, tension headache Smoking status: Former smoker, short time in high school    Objective:  Physical Exam BP 104/71 mmHg  Pulse 76  Temp(Src) 98.4 F (36.9 C) (Oral)  Ht 5\' 5"  (1.651 m)  Wt 225 lb (102.059 kg)  BMI 37.44 kg/m2  LMP 12/25/2014 GEN: NAD CV: RRR, no m/r/g PULM: Wheezing throughout with mild shortness of breath after talking but  otherwise normal WOB; no crackles; mild decreased air entry; Good O2 sat 98% Peak flow: 275, 325, 300 (325 is 75% of predicted for age and height in woman). EXTR: No LE edema or calf tenderness; Mild tender back of knees B No UE edema or skin changes HEENT: O/p clear with mild erythema   Assessment:     Julie [name] is a 34 y.o. female here for cough and dyspnea.    Plan:     # See problem list and after visit summary for problem-specific plans. - F/u diabetes dx with PCP. Possibly due to prednisone treatments.  # Health Maintenance: Not discussed today.  Follow-up: Follow up in 1 week for follow up of cough and dyspnea.  I have spent at least 25 minutes in room with patient, >50% of this time used in counseling.  Hilton Sinclair, MD [place]

## 2015-01-04 NOTE — Patient Instructions (Signed)
Toma prednisone diario para 5 dias. Toma dulera 2 puffs 2 veces al dia. Toma xopenex cada 4 horas para 2 dias y despues cada 4 horas cuando necesita. Regresa en 1 semana para rechequear o mas temprano si necesita. Llama para MAP programa.  Gevena Mart,  Hilton Sinclair, MD

## 2015-01-04 NOTE — Assessment & Plan Note (Signed)
Chronic cough; most likely asthma with exacerbation given wheezing on exam and peak flow 75% expected for age and height (mild asthma). Satting well on RA. No symptoms of GERD per pt. No LE asymmetry or h/o VTE. No pitting LE edema or pulmonary crackles, making new CHF dx unlikely in this 34 year old. Anxiety is likely playing a role.  - Reassured that this is most likely asthma. Discussed importance of med trial adherence given multiple things tried in the past that pt was unable to get making dx and adequate tx difficult. - Prednisone 5 day burst, xopenex q4 hours x 2 days followed by q4hrs prn, started dulera 2 puffs BID (2 week supply given today, urged pt to begin process for MAP today and f/u on this next visit). - If not improving at f/u, would consider switching to advair or other control inhaler and autoimmune workup with subjective hand and feet swelling and o/p itching (ANA, ACE level). Could also consider pulm referral and chest CT if not improved after that. - Recheck weight at f/u and order echo if still up. - F/u 1 week. F/u with Dr Valentina Lucks in 3 weeks who will likely also want spirometry recheck in 6 mo. May need another sample at f/u if has not secured MAP yet. - Precepted with Dr McDiarmid and discussed with Dr Valentina Lucks.

## 2015-01-05 NOTE — Progress Notes (Signed)
Due to language barrier, an interpreter was used.  Ana Pevida @ [employer] interpreted for visit.  Burna Forts, BSN, RN-BC

## 2015-01-12 ENCOUNTER — Ambulatory Visit: Payer: No Typology Code available for payment source | Admitting: Family Medicine

## 2015-01-29 ENCOUNTER — Other Ambulatory Visit: Payer: Self-pay | Admitting: Family Medicine

## 2015-01-29 DIAGNOSIS — R0600 Dyspnea, unspecified: Secondary | ICD-10-CM

## 2015-01-29 DIAGNOSIS — J4521 Mild intermittent asthma with (acute) exacerbation: Secondary | ICD-10-CM

## 2015-01-29 MED ORDER — MOMETASONE FURO-FORMOTEROL FUM 200-5 MCG/ACT IN AERO: 2.0000 | INHALATION_SPRAY | Freq: Two times a day (BID) | RESPIRATORY_TRACT | Status: DC

## 2015-03-16 ENCOUNTER — Other Ambulatory Visit: Payer: Self-pay | Admitting: *Deleted

## 2015-03-16 DIAGNOSIS — J4521 Mild intermittent asthma with (acute) exacerbation: Secondary | ICD-10-CM

## 2015-03-16 DIAGNOSIS — R0600 Dyspnea, unspecified: Secondary | ICD-10-CM

## 2015-03-16 MED ORDER — MOMETASONE FURO-FORMOTEROL FUM 200-5 MCG/ACT IN AERO: 2.0000 | INHALATION_SPRAY | Freq: Two times a day (BID) | RESPIRATORY_TRACT | Status: DC

## 2015-04-07 ENCOUNTER — Ambulatory Visit: Payer: Self-pay

## 2015-04-09 ENCOUNTER — Ambulatory Visit (INDEPENDENT_AMBULATORY_CARE_PROVIDER_SITE_OTHER): Payer: No Typology Code available for payment source | Admitting: Family Medicine

## 2015-04-09 VITALS — BP 138/85 | HR 97 | Temp 98.6°F | Wt 221.6 lb

## 2015-04-09 DIAGNOSIS — K429 Umbilical hernia without obstruction or gangrene: Secondary | ICD-10-CM

## 2015-04-09 DIAGNOSIS — E119 Type 2 diabetes mellitus without complications: Secondary | ICD-10-CM

## 2015-04-09 DIAGNOSIS — R21 Rash and other nonspecific skin eruption: Secondary | ICD-10-CM

## 2015-04-09 DIAGNOSIS — R0600 Dyspnea, unspecified: Secondary | ICD-10-CM

## 2015-04-09 DIAGNOSIS — R7401 Elevation of levels of liver transaminase levels: Secondary | ICD-10-CM

## 2015-04-09 DIAGNOSIS — R740 Nonspecific elevation of levels of transaminase and lactic acid dehydrogenase [LDH]: Secondary | ICD-10-CM

## 2015-04-09 LAB — COMPREHENSIVE METABOLIC PANEL
ALT: 89 U/L — AB (ref 0–35)
AST: 60 U/L — ABNORMAL HIGH (ref 0–37)
Albumin: 4.5 g/dL (ref 3.5–5.2)
Alkaline Phosphatase: 80 U/L (ref 39–117)
BUN: 10 mg/dL (ref 6–23)
CALCIUM: 9.3 mg/dL (ref 8.4–10.5)
CHLORIDE: 99 meq/L (ref 96–112)
CO2: 24 meq/L (ref 19–32)
Creat: 0.95 mg/dL (ref 0.50–1.10)
Glucose, Bld: 166 mg/dL — ABNORMAL HIGH (ref 70–99)
POTASSIUM: 3.8 meq/L (ref 3.5–5.3)
SODIUM: 137 meq/L (ref 135–145)
Total Bilirubin: 0.4 mg/dL (ref 0.2–1.2)
Total Protein: 7.8 g/dL (ref 6.0–8.3)

## 2015-04-09 LAB — CBC
HEMATOCRIT: 41 % (ref 36.0–46.0)
HEMOGLOBIN: 13.6 g/dL (ref 12.0–15.0)
MCH: 29.6 pg (ref 26.0–34.0)
MCHC: 33.2 g/dL (ref 30.0–36.0)
MCV: 89.1 fL (ref 78.0–100.0)
MPV: 12.9 fL — ABNORMAL HIGH (ref 8.6–12.4)
Platelets: 211 10*3/uL (ref 150–400)
RBC: 4.6 MIL/uL (ref 3.87–5.11)
RDW: 13.5 % (ref 11.5–15.5)
WBC: 7.4 10*3/uL (ref 4.0–10.5)

## 2015-04-09 LAB — POCT GLYCOSYLATED HEMOGLOBIN (HGB A1C): Hemoglobin A1C: 7

## 2015-04-09 MED ORDER — METFORMIN HCL 850 MG PO TABS: 850.0000 mg | ORAL_TABLET | Freq: Every day | ORAL | Status: DC

## 2015-04-09 NOTE — Assessment & Plan Note (Signed)
Acanthosis nigricans. Still unclear why so pruritic. Will hopefully improve with better glucose control. Will obtain CBC and CMP today. No signs or symptoms of systemic illness. If not improving, can consider trial of topical diphenhydramine. Can consider scraping or punch biopsy vs referral to derm if continues.

## 2015-04-09 NOTE — Progress Notes (Signed)
Julie [name] is a 34 y.o. female who presents to the [facility] today with a chief complaint of pruritic rash. Her concerns today include:  Phone interpretor was used for this visit.  HPI:  Pruritic Rash. Patient with several year history of intense pruritis in her armpits bilaterally. States that the skin is darker in appearance, but has not noticed any erythema or exudate. Also states that she notices a foul smelling odor when she sweats. Has tried various "powders and creams" but none have helped. Rash becomes so pruritic that she has to use steel wool to scratch. Denies any rash in other parts of her body. No joint pains. No fevers.  Abdominal Mass Patient with prior diagnosis of umbilical hernia. States that she has a mass near her umbilicus that increases in size when she bears down. Mass has become more painful over the past 2 weeks. No severe abdominal pain. No nausea or vomiting. No diarrhea or constipation  T2DM Patient has been off metformin for the past month. Currently denies any polyuria or polydipsia and no extremities tingling or numbness. When discussing the patient's diagnosis of diabetes, she becomes very anxious and tearful, stating "this is a death sentence" and "I have seen people who diabetes who die piece by piece." Patient also asked "whats the point of taking medication if I will always carry this diagnosis." Counseled patient that her blood sugars are currently at goal and that it is very important for her to continue taking the metformin as well as continuing to work on lifestyle modifications in order to prevent these complications. Educated patient on goal A1c levels and that her short course of steroids would not alter her number enough to make her not have the diagnosis of diabetes. I told her that infections and amputations are a risk of diabetes, but only if there is poor glucose control. Offered diabetes education/counseling as well as dietician referral, however  patient declined both of these.  I spent >45 minutes face to face with the patient counseling her on the above issues.    ROS: As per HPI, otherwise all systems reviewed and are negative.  Past Medical History - Reviewed and updated Patient Active Problem List   Diagnosis Date Noted  . Asthma with acute exacerbation 01/04/2015  . Tension headache 11/25/2014  . Dyspnea 08/13/2014  . Chronic cough 06/17/2014  . Mid back pain (pleuritic) 04/28/2014  . Periumbilical hernia 46/56/8127  . Diabetes mellitus type 2, uncomplicated 51/70/0174  . Knee meniscus pain 09/09/2013  . Enlargement of lymph nodes 03/03/2013  . NASH (nonalcoholic steatohepatitis) 02/25/2013  . Rash 10/07/2012  . Osteoarthritis 07/23/2012  . Depression 05/11/2012  . Hyperlipidemia LDL goal < 160 04/29/2012  . Lipoma of lower extremity 02/27/2012  . Secondary amenorrhea 01/22/2012  . Obesity, Class II, BMI 35-39.9 01/22/2012    Medications- reviewed and updated Current Outpatient Prescriptions  Medication Sig Dispense Refill  . cetirizine (ZYRTEC ALLERGY) 10 MG tablet Take 1 tablet (10 mg total) by mouth daily. 30 tablet 1  . metFORMIN (GLUCOPHAGE) 850 MG tablet Take 1 tablet (850 mg total) by mouth daily with breakfast. 30 tablet 2  . NEOMYCIN-POLYMYXIN-HYDROCORTISONE (CORTISPORIN) 1 % SOLN otic solution Place 3 drops into both ears 4 (four) times daily. 10 mL 0   No current facility-administered medications for this visit.    Objective: Physical Exam: BP 138/85 mmHg  Pulse 97  Temp(Src) 98.6 F (37 C) (Oral)  Wt 221 lb 9.6 oz (100.517 kg)  Gen:  NAD, resting comfortably CV: RRR with no murmurs appreciated Lungs: NWOB, CTAB with no crackles, wheezes, or rhonchi Abdomen: Obese, Normal bowel sounds present. Soft, Nontender, Nondistended. 2cm umbilical hernia, easily reducible Ext: no edema Skin: dark velvety rash in bilateral axilla, no erythema or exudates. Neuro: grossly normal, moves all  extremities  Results for orders placed or performed in visit on 04/09/15 (from the past 72 hour(s))  POCT A1C     Status: None   Collection Time: 04/09/15  2:03 PM  Result Value Ref Range   Hemoglobin A1C 7.0     A/P: See problem list  Rash Acanthosis nigricans. Still unclear why so pruritic. Will hopefully improve with better glucose control. Will obtain CBC and CMP today. No signs or symptoms of systemic illness. If not improving, can consider trial of topical diphenhydramine. Can consider scraping or punch biopsy vs referral to derm if continues.    Dyspnea Patient reports she has been completely asymptomatic since first contact with Dr T in January. Symptoms stopped before trying medications. No further work up or management at this time.    Periumbilical hernia Reducible 2cm umbilical hernia. Will refer to surgery. No signs or symptoms of strangulation.    Diabetes mellitus type 2, uncomplicated D5H at goal, though elevated since last visit likely due to being off metformin for 1 month. Will restart metformin today. Of note, patient is extremely anxious about this diagnosis and seems to have poor insight. She became tearful and anxious today when discussing this. Did not have time to discuss foot or eye screening today. Will attempt to discuss at next appointment. Also offered diabetes education/support however patient declined.  Would likely benefit from discussion about anxiety related to this diagnosis and in general, though I am not sure if patient would be willing to engage in this discussion.     Orders Placed This Encounter  Procedures  . CBC  . Comprehensive metabolic panel  . Ambulatory referral to General Surgery    Referral Priority:  Routine    Referral Type:  Surgical    Referral Reason:  Specialty Services Required    Requested Specialty:  General Surgery    Number of Visits Requested:  1  . POCT A1C    Meds ordered this encounter  Medications  .  metFORMIN (GLUCOPHAGE) 850 MG tablet    Sig: Take 1 tablet (850 mg total) by mouth daily with breakfast.    Dispense:  30 tablet    Refill:  2     Caleb M. Jerline Pain, [place] Resident PGY-1 04/09/2015 5:14 PM

## 2015-04-09 NOTE — Assessment & Plan Note (Signed)
Patient reports she has been completely asymptomatic since first contact with Dr T in January. Symptoms stopped before trying medications. No further work up or management at this time.

## 2015-04-09 NOTE — Assessment & Plan Note (Signed)
Reducible 2cm umbilical hernia. Will refer to surgery. No signs or symptoms of strangulation.

## 2015-04-09 NOTE — Patient Instructions (Signed)
Thank you for coming to the clinic today. It was nice seeing you.  For your rash, I think it is due to acanthosis nigricans, which is a rash associated with high blood sugar. We will be running blood tests today to check other causes.  I put in a referral to surgery to have them look at your hernia.  I would like for you to continue taking the metformin. If you would like to see a diabetes educator or go to a diabetes group please let me know.  See you again in 3-6 months.

## 2015-04-09 NOTE — Assessment & Plan Note (Signed)
A1c at goal, though elevated since last visit likely due to being off metformin for 1 month. Will restart metformin today. Of note, patient is extremely anxious about this diagnosis and seems to have poor insight. She became tearful and anxious today when discussing this. Did not have time to discuss foot or eye screening today. Will attempt to discuss at next appointment. Also offered diabetes education/support however patient declined.  Would likely benefit from discussion about anxiety related to this diagnosis and in general, though I am not sure if patient would be willing to engage in this discussion.

## 2015-04-13 ENCOUNTER — Other Ambulatory Visit: Payer: Self-pay | Admitting: Family Medicine

## 2015-04-13 DIAGNOSIS — R740 Nonspecific elevation of levels of transaminase and lactic acid dehydrogenase [LDH]: Principal | ICD-10-CM

## 2015-04-13 DIAGNOSIS — R7401 Elevation of levels of liver transaminase levels: Secondary | ICD-10-CM

## 2015-04-13 NOTE — Assessment & Plan Note (Signed)
Called patient to inform of elevated transaminases. 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 Will obtain hepatitis panel and iron panel. Can consider RUQ [place] for more definitive diagnosis. Will need continued support and education for weight loss.

## 2015-04-14 NOTE — Progress Notes (Signed)
Do we need to call back and schedule a lab appt for this? Julie [name],CMA

## 2015-04-15 ENCOUNTER — Other Ambulatory Visit: Payer: No Typology Code available for payment source

## 2015-04-15 DIAGNOSIS — R740 Nonspecific elevation of levels of transaminase and lactic acid dehydrogenase [LDH]: Principal | ICD-10-CM

## 2015-04-15 DIAGNOSIS — R7401 Elevation of levels of liver transaminase levels: Secondary | ICD-10-CM

## 2015-04-15 LAB — IRON AND TIBC
%SAT: 21 % (ref 20–55)
Iron: 89 ug/dL (ref 42–145)
TIBC: 431 ug/dL (ref 250–470)
UIBC: 342 ug/dL (ref 125–400)

## 2015-04-15 NOTE — Progress Notes (Signed)
Pt is scheduled for today at 4pm. [facility]

## 2015-04-15 NOTE — Progress Notes (Signed)
Labs done today Yaileen Hofferber 

## 2015-04-15 NOTE — Progress Notes (Signed)
I told the patient she could make the appointment, but it might be better if we do it for her.  Julie [name]. Jerline Pain, [place] Resident PGY-1 04/15/2015 11:00 AM

## 2015-04-16 LAB — HEPATITIS B CORE ANTIBODY, TOTAL: Hep B Core Total Ab: NONREACTIVE

## 2015-04-16 LAB — HEPATITIS C ANTIBODY: HCV AB: NEGATIVE

## 2015-04-16 LAB — HEPATITIS B SURFACE ANTIBODY,QUALITATIVE: HEP B S AB: POSITIVE — AB

## 2015-04-19 LAB — HEPATITIS B E ANTIBODY: Hepatitis Be Antibody: NONREACTIVE

## 2015-04-20 ENCOUNTER — Encounter: Payer: Self-pay | Admitting: Family Medicine

## 2015-04-20 NOTE — Progress Notes (Signed)
Results reviewed. Patient is status post HBV vaccination. Letter sent. Can consider imaging in the future to evaluate for NAFLD.   [name]. Jerline Pain, [place] Resident PGY-1 04/20/2015 2:50 PM

## 2015-04-22 ENCOUNTER — Telehealth: Payer: Self-pay | Admitting: *Deleted

## 2015-04-22 NOTE — Telephone Encounter (Signed)
Received a refill request from [facility] for Dulera.  Medication is not on current med list. Form placed in provider box for review.  Derl Barrow, RN

## 2015-04-23 NOTE — Telephone Encounter (Signed)
Form filled out and faced to [place]. Jerline Pain, [place] Resident PGY-1 04/23/2015 2:42 PM

## 2015-05-11 ENCOUNTER — Ambulatory Visit (INDEPENDENT_AMBULATORY_CARE_PROVIDER_SITE_OTHER): Payer: No Typology Code available for payment source | Admitting: Family Medicine

## 2015-05-11 ENCOUNTER — Encounter: Payer: Self-pay | Admitting: Family Medicine

## 2015-05-11 VITALS — BP 124/71 | HR 82 | Temp 98.1°F | Ht 66.0 in | Wt 219.2 lb

## 2015-05-11 DIAGNOSIS — J209 Acute bronchitis, unspecified: Secondary | ICD-10-CM

## 2015-05-11 MED ORDER — AZITHROMYCIN 250 MG PO TABS: ORAL_TABLET | ORAL | Status: DC

## 2015-05-11 NOTE — Progress Notes (Signed)
Patient ID: Julie [name], female   DOB: 12/26/1980, 34 y.o.   MRN: [id number]   Spanish interpreter utilized during today's visit.   HPI:  Pt presents for a same day appointment to discuss cough and congestion.  1.5 weeks of nasal congestion. Cannot breathe through nose. No fever. Has been coughing. Cough has been nonproductive but can feel mucous in her chest. Eating and drinking well. Hears a noise in her chest. Trouble sleeping due to nasal congestion.   Has tried mucinex and nyquil. Has taken nasacort and zyrtec without any help.   ROS: See HPI  [place]: hx possible asthma but has not had PFTs, T2DM, arthritis, HLD, depression  PHYSICAL EXAM: BP 124/71 mmHg  Pulse 82  Temp(Src) 98.1 F (36.7 C) (Oral)  Ht 5\' 6"  (1.676 m)  Wt 219 lb 3.2 oz (99.428 kg)  BMI 35.40 kg/m2  LMP 04/30/2015 Gen: NAD HEENT: NCAT, MMM, oropharynx clear, TM's clear bilat, no anterior cervical l ymphadenopathy, no maxillary or frontal sinus tenderness Heart: RRR no murmur Lungs: CTAB NWOB, frequent cough, no crackles or wheezes, good air movement Neuro: grossly nonfocal speech normal  ASSESSMENT/PLAN:  1. Cough and nasal congestion: initially thought allergic, but pt reports has been taking Zyrtec and Nasacort as prescribed. Given duration of symptoms going on 1.5 weeks, it is reasonable to treat for atypical pneumonia/bronchitis. Will give azithromycin 5 day course as it will also have some activity against sinusitis. Patient is not wheezing today, no signs of asthma exacerbation. Follow-up if not improving.  FOLLOW UP: F/u as needed if symptoms worsen or do not improve.   [place]. Ardelia Mems, [place]

## 2015-05-11 NOTE — Patient Instructions (Signed)
Take azithromycin 2 pills today, then 1 pill per day on the next 4 days Return if not improving later this week Drink plenty of fluids  Be well, Dr. Ardelia Mems

## 2015-07-12 ENCOUNTER — Ambulatory Visit (INDEPENDENT_AMBULATORY_CARE_PROVIDER_SITE_OTHER): Payer: No Typology Code available for payment source | Admitting: Family Medicine

## 2015-07-12 ENCOUNTER — Encounter: Payer: Self-pay | Admitting: Family Medicine

## 2015-07-12 VITALS — BP 129/63 | HR 84 | Temp 98.2°F | Ht 66.0 in | Wt 211.8 lb

## 2015-07-12 DIAGNOSIS — M79642 Pain in left hand: Secondary | ICD-10-CM

## 2015-07-12 DIAGNOSIS — M79641 Pain in right hand: Secondary | ICD-10-CM

## 2015-07-12 NOTE — Patient Instructions (Signed)
It was a pleasure seeing you today in our clinic. Today we discussed your hand pain. Here is the treatment plan we have discussed and agreed upon together:   - I have ordered labs to be drawn. Please come back and get these drawn anytime tomorrow or Wednesday.  - I have ordered to have x-rays of your hands. This will be done at [facility].

## 2015-07-15 ENCOUNTER — Ambulatory Visit (HOSPITAL_COMMUNITY)
Admission: RE | Admit: 2015-07-15 | Discharge: 2015-07-15 | Disposition: A | Discharge status: Home / Self Care | Payer: No Typology Code available for payment source | Source: Ambulatory Visit | Attending: Family Medicine | Admitting: Family Medicine

## 2015-07-15 ENCOUNTER — Other Ambulatory Visit (INDEPENDENT_AMBULATORY_CARE_PROVIDER_SITE_OTHER): Payer: Self-pay

## 2015-07-15 DIAGNOSIS — M79642 Pain in left hand: Principal | ICD-10-CM

## 2015-07-15 DIAGNOSIS — M13841 Other specified arthritis, right hand: Secondary | ICD-10-CM | POA: Insufficient documentation

## 2015-07-15 DIAGNOSIS — M79641 Pain in right hand: Secondary | ICD-10-CM

## 2015-07-15 LAB — RHEUMATOID FACTOR: Rhuematoid fact SerPl-aCnc: <10 IU/mL (ref <=14)

## 2015-07-15 LAB — POCT SEDIMENTATION RATE: POCT SED RATE: 10 mm/h (ref 0–22)

## 2015-07-15 NOTE — Progress Notes (Signed)
HPI  CC: bilateral hand pain Patient c/o bilateral hand/finger pain. Pain is aching in nature. Worse in the morning. Denies trauma or inciting events. No radiation. Nothing seems to help the pain. Patient has never had anything like this in the past. Pain started ~2wks ago. No rashes, lesions, erythema, or edema. Pain began at the same time in both hands. Patient unsure if associated/exacerbated  w/ use. No other join pain.  Denies trauma, fever, chills, erythema, edema, rash, lesions, weakness, numbness, muscle pain, insect bites, or prior injuries.  Review of Systems   See HPI for ROS. All other systems reviewed and are negative.  Past medical history and social history reviewed and updated in the EMR as appropriate.  Objective: BP 129/63 mmHg  Pulse 84  Temp(Src) 98.2 F (36.8 C) (Oral)  Ht 5' 6"  (1.676 m)  Wt 211 lb 12.8 oz (96.072 kg)  BMI 34.20 kg/m2 Gen: NAD, alert, cooperative, and pleasant. CV: RRR, no murmur Resp: CTAB, no wheezes, non-labored Ext: No edema/erythema, warm, FROM throughout, strength 5/5 throughout, no rash or lesions, Distal phalanx of 5th digit of Rt hand w/ some radial deviation, possible early development of [place] Nodes present. Rt sided axillary LN present -- not painful Neuro: Alert and oriented, sensation fully intact, No gross deficits  Assessment and plan:  Bilateral hand pain Bilateral hand pain patient is experiencing is currently of unknown etiology. 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 DDx is concerning for more systemic issue like Rheumatoid Arthritis -- bilateral, early/quick onset; however no personal or fam Hx of autoimmune -- this route is worth exploring due to its acute and bilateral nature, also the deviation of her distal phalanx makes one concerned. Lyme dz associated arthritis is very unlikely as is any concern for septic arthritis.  - Bilateral hand XR  ordered - ESR, CCP ab IgG, RhF - encouraged Ice, heat, rest, and NSAIDs for pain  Note: LN in Rt axilla may be due to inflammatory process (especially if dx is RA); However, patient's PCP may want to reassess this at next visit, especially if mass persists.    Orders Placed This Encounter  Procedures  . DG Hand Complete Right    Standing Status: Future     Number of Occurrences: 1     Standing Expiration Date: 09/11/2016    Order Specific Question:  Reason for Exam (SYMPTOM  OR DIAGNOSIS REQUIRED)    Answer:  arthritic pain    Order Specific Question:  Is the patient pregnant?    Answer:  No    Order Specific Question:  Preferred imaging location?    Answer:  [facility]  . DG Hand Complete Left    Standing Status: Future     Number of Occurrences: 1     Standing Expiration Date: 09/11/2016    Order Specific Question:  Reason for Exam (SYMPTOM  OR DIAGNOSIS REQUIRED)    Answer:  arthritic pain    Order Specific Question:  Is the patient pregnant?    Answer:  No    Order Specific Question:  Preferred imaging location?    Answer:  [facility]  . Sedimentation rate    Standing Status: Future     Number of Occurrences: 1     Standing Expiration Date: 07/11/2016  . Cyclic citrul peptide antibody, IgG    Standing Status: Future     Number of Occurrences: 1  Standing Expiration Date: 07/11/2016  . Rheumatoid factor    Standing Status: Future     Number of Occurrences: 1     Standing Expiration Date: 07/11/2016    Elberta Leatherwood, MD,MS,  PGY2 07/15/2015 7:43 PM

## 2015-07-15 NOTE — Progress Notes (Signed)
Labs done today Julie [name] 

## 2015-07-15 NOTE — Assessment & Plan Note (Signed)
Bilateral hand pain patient is experiencing is currently of unknown etiology. 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 DDx is concerning for more systemic issue like Rheumatoid Arthritis -- bilateral, early/quick onset; however no personal or fam Hx of autoimmune -- this route is worth exploring due to its acute and bilateral nature, also the deviation of her distal phalanx makes one concerned. Lyme dz associated arthritis is very unlikely as is any concern for septic arthritis.  - Bilateral hand XR ordered - ESR, CCP ab IgG, RhF - encouraged Ice, heat, rest, and NSAIDs for pain  Note: LN in Rt axilla may be due to inflammatory process (especially if dx is RA); However, patient's PCP may want to reassess this at next visit, especially if mass persists.

## 2015-07-16 LAB — CYCLIC CITRUL PEPTIDE ANTIBODY, IGG

## 2015-07-16 LAB — ANA: Anti Nuclear Antibody(ANA): NEGATIVE

## 2015-07-26 ENCOUNTER — Encounter: Payer: Self-pay | Admitting: Family Medicine

## 2015-08-10 ENCOUNTER — Encounter: Payer: Self-pay | Admitting: Family Medicine

## 2015-08-10 ENCOUNTER — Ambulatory Visit (INDEPENDENT_AMBULATORY_CARE_PROVIDER_SITE_OTHER): Payer: Self-pay | Admitting: Family Medicine

## 2015-08-10 VITALS — BP 113/64 | HR 84 | Temp 98.4°F | Ht 65.0 in | Wt 203.0 lb

## 2015-08-10 DIAGNOSIS — M79642 Pain in left hand: Secondary | ICD-10-CM

## 2015-08-10 DIAGNOSIS — K429 Umbilical hernia without obstruction or gangrene: Secondary | ICD-10-CM

## 2015-08-10 DIAGNOSIS — M79641 Pain in right hand: Secondary | ICD-10-CM

## 2015-08-10 DIAGNOSIS — E119 Type 2 diabetes mellitus without complications: Secondary | ICD-10-CM

## 2015-08-10 DIAGNOSIS — R21 Rash and other nonspecific skin eruption: Secondary | ICD-10-CM

## 2015-08-10 LAB — POCT GLYCOSYLATED HEMOGLOBIN (HGB A1C): HEMOGLOBIN A1C: 9.8

## 2015-08-10 MED ORDER — LORATADINE 10 MG PO TABS: 10.0000 mg | ORAL_TABLET | Freq: Every day | ORAL | Status: DC

## 2015-08-10 MED ORDER — METFORMIN HCL 850 MG PO TABS: 850.0000 mg | ORAL_TABLET | Freq: Every day | ORAL | Status: DC

## 2015-08-10 MED ORDER — TRIAMCINOLONE ACETONIDE 0.1 % EX CREA
1.0000 | TOPICAL_CREAM | Freq: Two times a day (BID) | CUTANEOUS | Status: DC
Start: 2015-08-10 — End: 2016-03-08
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
## 2015-08-10 NOTE — Patient Instructions (Signed)
Thank you for coming to the clinic today. It was nice seeing you.  For your arm itching, we will prescribe you a steroid cream and itching pill. Please take one pill per day.  For your hand pain, I think this is most likely due to carpal tunnel syndrome. Please use wrist splints at night to help with this.  It is VERY IMPORTANT that you take your metformin. This can help you lose weight and will help reduce your blood sugar.  Please come back in 3 months or sooner as needed.  Take Care,   Dr Jerline Pain

## 2015-08-10 NOTE — Assessment & Plan Note (Signed)
Pain more consistent with neuropathic etiology given numb and tingling description. Likely secondary to diabetic neuropathy given her poorly controlled A1c, though may have a component of carpal tunnel syndrome given positive Phalen. Recommended patient to sleep with wrist splints. Also will treat DM as outlined below.

## 2015-08-10 NOTE — Assessment & Plan Note (Signed)
Acanthosis nigricans. Again, unclear why it is so pruritic. Will try topical triamcinalone cream and daily claritin. May consider referral to dermatology if continues to be pruritic.

## 2015-08-10 NOTE — Assessment & Plan Note (Signed)
No signs or symptoms of incarceration or strangulation. Will send in referral to surgery.

## 2015-08-10 NOTE — Assessment & Plan Note (Signed)
A1c significantly elevated to 9.8 today. Patient has been off metformin. Will restart today. Discussed the importance of this medication not only for better glucose control, but potentially also to help with her pruritic rashes in her axilla, and likely developing neuropathic pain. Will follow up in 3 months. If A1c still elevated, will add additional oral anti-hyperglycemic.   Will need to discuss eye and feet examinations at next visit. This was not performed today due to time constraints.

## 2015-08-11 ENCOUNTER — Other Ambulatory Visit: Payer: Self-pay | Admitting: *Deleted

## 2015-08-11 DIAGNOSIS — R0600 Dyspnea, unspecified: Secondary | ICD-10-CM

## 2015-08-11 DIAGNOSIS — J4521 Mild intermittent asthma with (acute) exacerbation: Secondary | ICD-10-CM

## 2015-08-11 MED ORDER — MOMETASONE FURO-FORMOTEROL FUM 200-5 MCG/ACT IN AERO: 2.0000 | INHALATION_SPRAY | Freq: Two times a day (BID) | RESPIRATORY_TRACT | Status: DC

## 2015-09-01 ENCOUNTER — Ambulatory Visit (INDEPENDENT_AMBULATORY_CARE_PROVIDER_SITE_OTHER): Payer: No Typology Code available for payment source | Admitting: Family Medicine

## 2015-09-01 VITALS — BP 135/66 | HR 73 | Temp 98.4°F | Ht 65.0 in | Wt 199.4 lb

## 2015-09-01 DIAGNOSIS — R21 Rash and other nonspecific skin eruption: Secondary | ICD-10-CM

## 2015-09-01 MED ORDER — HYDROXYZINE HCL 25 MG PO TABS: 25.0000 mg | ORAL_TABLET | Freq: Three times a day (TID) | ORAL | Status: DC | PRN

## 2015-09-01 NOTE — Patient Instructions (Signed)
Thank you so much for coming to visit me today! We did a punch biopsy and we will contact you with the results. Please use Eucerin cream on your skin, which you can buy over the counter.   Thanks again! Dr. Gerlean Ren

## 2015-09-02 ENCOUNTER — Telehealth: Payer: Self-pay | Admitting: *Deleted

## 2015-09-02 NOTE — Telephone Encounter (Signed)
Dawn from [facility] called needing clarification on which hydroxyzine to prescribe.  They have both hydroxyzine HCL and hydroxyzine pamoate.  Please call the pharmacy to clarify at [phone].  Derl Barrow, RN

## 2015-09-03 NOTE — Telephone Encounter (Signed)
[facility] contacted. Prescriptoin for Hydroxyzine HCL ordered.

## 2015-09-05 NOTE — Progress Notes (Signed)
Subjective:     Patient ID: Julie [name], female   DOB: 05/28/1981, 34 y.o.   MRN: [id number]  HPI Julie [name] is a 34yo female presenting with worsening upper extremity itching. -Diagnosed with acanthosis nigricans at office visit on 8/23 and prescribed Loratadine and Triamcinolone. - Has been using Loratadine and Triamcinolone, however symptoms seem to be worsening - Reports itching in bilateral upper arms with left worse than right and now starting on left leg. Reports appears to be spreading - Has noted itching x1-1.5 months, has been worsening since Sunday. Also has chronic itching in axilla bilaterally for several years. - Denies any other family members or friends having rash - Denies history of bites - Denies changes in soap, shampoo, detergent, etc. - Reports no relief with Benadryl - Denies fever  Review of Systems Per HPI    Objective:   Physical Exam  Constitutional: She appears well-developed and well-nourished. No distress.  HENT:  Head: Normocephalic and atraumatic.  Cardiovascular: Normal rate and regular rhythm.  Exam reveals no gallop and no friction rub.   No murmur heard. Pulmonary/Chest: Effort normal. No respiratory distress. She has no wheezes. She has no rales.  Musculoskeletal: She exhibits no edema.  Skin:  Erythematous maculopapular rash on bilateral upper arms, left worse than right. Excoriations noted. Actively scratching throughout visit. Acanthosis nigricans of axilla bilaterally.       Assessment and Plan:     Rash - Punch biopsy obtained. Written consent obtained. Area sterilized and anesthetized with lidocaine. Punch biopsy taken from left upper arm. No complications noted. - Recommended Eucerin cream for dry skin of upper arms.  - Prescripition for Hydroxyzine called into [facility] - Management pending results of biopsy. If inconclusive, consider dermatology referral.  - Consider psychiatric cause of rash as well if inconclusive

## 2015-09-06 NOTE — Assessment & Plan Note (Addendum)
-   Punch biopsy obtained. Written consent obtained. Area sterilized and anesthetized with lidocaine. Punch biopsy taken from left upper arm. No complications noted. - Recommended Eucerin cream for dry skin of upper arms.  - Prescripition for Hydroxyzine called into [facility] - Management pending results of biopsy. If inconclusive, consider dermatology referral.  - Consider psychiatric cause of rash as well if inconclusive

## 2015-09-13 ENCOUNTER — Telehealth: Payer: Self-pay | Admitting: Family Medicine

## 2015-09-13 DIAGNOSIS — R21 Rash and other nonspecific skin eruption: Secondary | ICD-10-CM

## 2015-09-13 NOTE — Telephone Encounter (Signed)
Want someone to contact her regarding latest lab results

## 2015-09-13 NOTE — Telephone Encounter (Signed)
Will forward to Dr. Gerlean Ren who saw patient most recently. Jazmin Hartsell,CMA

## 2015-09-15 NOTE — Telephone Encounter (Signed)
Discussed pathology results. States itching and rash are not improved. Referral to Dermatology placed. Recommended follow up with PCP.

## 2015-09-27 ENCOUNTER — Ambulatory Visit (INDEPENDENT_AMBULATORY_CARE_PROVIDER_SITE_OTHER): Payer: No Typology Code available for payment source | Admitting: Family Medicine

## 2015-09-27 ENCOUNTER — Encounter: Payer: Self-pay | Admitting: Family Medicine

## 2015-09-27 VITALS — BP 124/65 | HR 83 | Temp 98.6°F | Ht 65.0 in | Wt 191.6 lb

## 2015-09-27 DIAGNOSIS — M7989 Other specified soft tissue disorders: Secondary | ICD-10-CM

## 2015-09-27 NOTE — Progress Notes (Signed)
   Subjective:    Patient ID: Julie [name], female    DOB: 14-Aug-1981, 34 y.o.   MRN: [id number]  Seen for Same day visit for   CC: left leg swelling  She feels a swelling in the back of her left knee that radiates distally down to her left foot. She comes in today after one and a half months of this problem because the pressure seems be getting worse. She denies any trauma or injury to her left leg. She has never had these symptoms before. She denies any previous history of DVT or family history of clot. She does not smoke tobacco, she is not on birth control, and she denies any recent travel. She denies any chest pain, shortness of breath, hemoptysis, or cough. The pressure seems to be waking her up from her sleep but is not painful.    Review of Systems   See HPI for ROS. Objective:  BP 124/65 mmHg  Pulse 83  Temp(Src) 98.6 F (37 C) (Oral)  Ht 5\' 5"  (1.651 m)  Wt 191 lb 9.6 oz (86.909 kg)  BMI 31.88 kg/m2  General: NAD Cardiac: RRR, normal heart sounds, no murmurs. 2+ DP and PT pulses bilaterally Respiratory: CTAB, normal effort Extremities: swollen left leg occurring from the left knee symmetrically swollen distally. Pulses intact distally. Sensation intact distally. Pain reproducible palpation of the posterior aspect of the knee. There is no streaking or erythema of the left calf. Normal plantar and dorsal flexion bilaterally. Normal range of motion of left knee. Negative joint line tenderness of the left knee. Skin: warm and dry, no rashes noted    Assessment & Plan:   Left leg swelling Concern for DVT. Most likely ruptured Baker's cyst. Doubtful for any muscle tear or intra-articular knee problem with no prior injury Wells score for DVT ~1  - Plan to obtain venous duplex. Staff will call her tomorrow for scheduling his close tonight - Given indications in order to seek immediate care - discussed with Dr. Nori Riis.

## 2015-09-27 NOTE — Patient Instructions (Signed)
Thank you for coming in,   We are going to get an ultrasound of your leg.   Sign up for My Chart to have easy access to your labs results, and communication with your Primary care physician   Please feel free to call with any questions or concerns at any time, at [phone]. --Dr. Raeford Razor

## 2015-09-27 NOTE — Assessment & Plan Note (Signed)
Concern for DVT. Most likely ruptured Baker's cyst. Doubtful for any muscle tear or intra-articular knee problem with no prior injury Wells score for DVT ~1  - Plan to obtain venous duplex. Staff will call her tomorrow for scheduling his close tonight - Given indications in order to seek immediate care - discussed with Dr. Nori Riis.

## 2015-09-28 ENCOUNTER — Encounter (HOSPITAL_COMMUNITY): Payer: No Typology Code available for payment source

## 2015-09-29 ENCOUNTER — Ambulatory Visit (HOSPITAL_COMMUNITY)
Admission: RE | Admit: 2015-09-29 | Discharge: 2015-09-29 | Disposition: A | Discharge status: Home / Self Care | Payer: Self-pay | Source: Ambulatory Visit | Attending: Family Medicine | Admitting: Family Medicine

## 2015-09-29 DIAGNOSIS — M7989 Other specified soft tissue disorders: Secondary | ICD-10-CM | POA: Insufficient documentation

## 2015-09-29 NOTE — Progress Notes (Signed)
*  Preliminary Results* Left lower extremity venous duplex completed. Left lower extremity is negative for deep vein thrombosis. There is no evidence of left Baker's cyst.  09/29/2015 3:41 PM  Maudry Mayhew, RVT, RDCS, RDMS

## 2015-10-04 ENCOUNTER — Encounter: Payer: Self-pay | Admitting: Family Medicine

## 2015-10-06 ENCOUNTER — Other Ambulatory Visit: Payer: Self-pay | Admitting: *Deleted

## 2015-10-06 DIAGNOSIS — R0600 Dyspnea, unspecified: Secondary | ICD-10-CM

## 2015-10-06 DIAGNOSIS — J4521 Mild intermittent asthma with (acute) exacerbation: Secondary | ICD-10-CM

## 2015-10-06 MED ORDER — MOMETASONE FURO-FORMOTEROL FUM 200-5 MCG/ACT IN AERO: 2.0000 | INHALATION_SPRAY | Freq: Two times a day (BID) | RESPIRATORY_TRACT | Status: DC

## 2015-10-08 ENCOUNTER — Ambulatory Visit: Payer: No Typology Code available for payment source | Admitting: Family Medicine

## 2015-10-15 ENCOUNTER — Ambulatory Visit (INDEPENDENT_AMBULATORY_CARE_PROVIDER_SITE_OTHER): Payer: No Typology Code available for payment source | Admitting: Obstetrics and Gynecology

## 2015-10-15 ENCOUNTER — Encounter: Payer: Self-pay | Admitting: Obstetrics and Gynecology

## 2015-10-15 VITALS — BP 113/71 | HR 88 | Temp 98.1°F | Ht 65.0 in | Wt 193.5 lb

## 2015-10-15 DIAGNOSIS — M79605 Pain in left leg: Secondary | ICD-10-CM

## 2015-10-15 DIAGNOSIS — M79602 Pain in left arm: Secondary | ICD-10-CM

## 2015-10-15 MED ORDER — IBUPROFEN 600 MG PO TABS: 600.0000 mg | ORAL_TABLET | Freq: Three times a day (TID) | ORAL | Status: DC | PRN
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
## 2015-10-15 NOTE — Patient Instructions (Signed)
Here are some of the things we discussed today: -Ibuprofen given for discomfort -Think leg pain is musculoskeletal in nature -continue stretches -no signs of clot or cyst on ultrasound -referral placed for sports medicine  Please schedule a follow-up appointment as needed if symptoms not improving.  Thanks for allowing me to be a part of your care! Dr. Gerarda Fraction

## 2015-11-02 ENCOUNTER — Ambulatory Visit: Payer: No Typology Code available for payment source | Admitting: Family Medicine

## 2015-12-06 ENCOUNTER — Other Ambulatory Visit: Payer: Self-pay | Admitting: *Deleted

## 2015-12-06 DIAGNOSIS — R0600 Dyspnea, unspecified: Secondary | ICD-10-CM

## 2015-12-06 DIAGNOSIS — J4521 Mild intermittent asthma with (acute) exacerbation: Secondary | ICD-10-CM

## 2015-12-06 MED ORDER — MOMETASONE FURO-FORMOTEROL FUM 200-5 MCG/ACT IN AERO: 2.0000 | INHALATION_SPRAY | Freq: Two times a day (BID) | RESPIRATORY_TRACT | Status: DC

## 2015-12-06 NOTE — Telephone Encounter (Signed)
Rx filled.  [name]. Jerline Pain, [place] Resident PGY-2 12/06/2015 5:25 PM

## 2015-12-07 NOTE — Telephone Encounter (Signed)
LM for patient ok per DPR that script was called in and she would need to make an appt for asthma follow up with pcp. Jazmin Hartsell,CMA

## 2016-01-28 ENCOUNTER — Ambulatory Visit: Payer: Self-pay

## 2016-02-16 ENCOUNTER — Ambulatory Visit: Payer: Self-pay

## 2016-03-08 ENCOUNTER — Ambulatory Visit (INDEPENDENT_AMBULATORY_CARE_PROVIDER_SITE_OTHER): Payer: Self-pay | Admitting: Internal Medicine

## 2016-03-08 ENCOUNTER — Encounter: Payer: Self-pay | Admitting: Internal Medicine

## 2016-03-08 VITALS — BP 108/84 | HR 68 | Temp 97.8°F | Wt 194.3 lb

## 2016-03-08 DIAGNOSIS — J209 Acute bronchitis, unspecified: Secondary | ICD-10-CM

## 2016-03-08 DIAGNOSIS — R21 Rash and other nonspecific skin eruption: Secondary | ICD-10-CM

## 2016-03-08 DIAGNOSIS — E119 Type 2 diabetes mellitus without complications: Secondary | ICD-10-CM

## 2016-03-08 LAB — POCT GLYCOSYLATED HEMOGLOBIN (HGB A1C): HEMOGLOBIN A1C: 9.5

## 2016-03-08 MED ORDER — METFORMIN HCL 850 MG PO TABS: 850.0000 mg | ORAL_TABLET | Freq: Every day | ORAL | Status: DC

## 2016-03-08 MED ORDER — AZITHROMYCIN 250 MG PO TABS: ORAL_TABLET | ORAL | Status: DC

## 2016-03-08 NOTE — Assessment & Plan Note (Signed)
Pt with constant cough for the last 2 months. Has a history of bronchitis 1-2 years ago, treated with Azithromycin. Pt coughing frequently throughout exam. Lungs are clear with good air movement throughout all lung fields, so asthma exacerbation less likely. - Given that her cough has been going on for 2 months and she has a history of bronchitis that was successfully treated with Azithromycin in the past, will prescribe another course of Azithromycin 500mg  x 1 day, then 250mg  daily x 4 days. - Return precautions given - Follow-up if cough is not improved after antibiotic course

## 2016-03-08 NOTE — Progress Notes (Signed)
[facility] Phone: [phone]  Subjective:  Cough: Pt presents with cough for 2 months. She is having dry cough. She feels like she has phlegm in her chest but can't bring it up. She has tried Mucinex, Nyquil, and Dayquil. They have not helped at all. She is having some shortness of breath when her cough is really bad. Her cough is worse at night. Nothing helps the cough. Multiple people in her family had the flu last week. She received the flu shot this year. No congestion, no runny nose. She endorses intermittent subjective fevers. No nausea, no vomiting. She is not eating much, but has been drinking a lot of water. She has a history of asthma and uses Dulera daily, but it does not help the cough. Pt has had bronchitis in the past and was prescribed Azithromycin. This greatly helped her cough and she noticed a difference within 1 day of taking the antibiotic.  Diabetes: She is not taking any Metformin. She has been out of refills for months. She doesn't not check her blood sugars at home. No polyuria, no polydipsia. She has never had a problem tolerating the Metformin.  ROS: See HPI for pertinent positives and negatives Past Medical History- Asthma, T2DM, depression Reviewed problem list.  Medications- reviewed and updated Current Outpatient Prescriptions  Medication Sig Dispense Refill  . ibuprofen (ADVIL,MOTRIN) 600 MG tablet Take 1 tablet (600 mg total) by mouth every 8 (eight) hours as needed. 30 tablet 1  . metFORMIN (GLUCOPHAGE) 850 MG tablet Take 1 tablet (850 mg total) by mouth daily with breakfast. 30 tablet 2  . mometasone-formoterol (DULERA) 200-5 MCG/ACT AERO Inhale 2 puffs into the lungs 2 (two) times daily. 1 Inhaler 0   No current facility-administered medications for this visit.   Chief complaint-noted Family history reviewed for today's visit. No changes. Social history- patient is a never smoker  Objective: BP 108/84 mmHg  Pulse 68   Temp(Src) 97.8 F (36.6 C) (Oral)  Wt 194 lb 4.8 oz (88.134 kg)  SpO2 99%  LMP 02/10/2016 (Approximate) Gen: Ill-appearing but in NAD, coughing frequently throughout exam HEENT: NCAT, EOMI, MMM, TMs clear, oropharynx mildly erythematous, nasal turbinates mildly erythematous. Neck: FROM, supple, no cervical lymphadenopathy CV: RRR, no murmur Resp: Normal work of breathing, lungs CTAB, no wheezing, good air movement throughout all lung fields, coughing with deep inspiration Msk: No edema, warm, normal tone, moves UE/LE spontaneously Foot exam: Feet appear normal bilaterally with no ulcerations or calluses, monofilament testing normal throughout feet bilaterally, 2+ DP and PT pulses, feet are warm and well-perfused Neuro: Alert and oriented, no gross deficits Skin: No rashes, no lesions Psych: Appropriate behavior  Assessment/Plan: Acute Bronchitis: Pt with constant cough for the last 2 months. Has a history of bronchitis 1-2 years ago, treated with Azithromycin. Pt coughing frequently throughout exam. Lungs are clear with good air movement throughout all lung fields, so asthma exacerbation less likely. - Given that her cough has been going on for 2 months and she has a history of bronchitis that was successfully treated with Azithromycin in the past, will prescribe another course of Azithromycin 500mg  x 1 day, then 250mg  daily x 4 days. - Return precautions given - Follow-up if cough is not improved after antibiotic course  Type 2 Diabetes: Uncontrolled. Pt has not been seen for diabetes follow-up ~8 months. Last A1c was 9.8%. Has not been taking her Metformin because she ran out of refills. She is prescribed Metformin 850mg  daily. No  side effects. - Hemoglobin A1c performed today was 9.5% - Will restart Metformin 850mg  daily - Urine microalbumin performed today - Diabetic foot exam performed today was normal - Ophthalmology referral placed for annual eye exam - Follow-up with PCP in 3  months   Hyman Bible, MD PGY-1

## 2016-03-08 NOTE — Patient Instructions (Signed)
It was so nice to meet you!  I hope you start feeling better soon.  For your diabetes, please start taking your Metformin again. Your A1c that was measured today tells [place] that your blood sugar is not well-controlled.   For the bronchitis, I have prescribed an antibiotic called Azithromycin. Please take 2 tablets on the first day, and then 1 tablet per day after that.   If you cough is not better after finishing the antibiotic, please come back to see [place].  I have put in a referral for you to see the eye doctor for your yearly exam. You should be receiving a call in the next 2 weeks to schedule this appointment.  -Dr. Brett Albino

## 2016-03-08 NOTE — Assessment & Plan Note (Addendum)
Uncontrolled. Pt has not been seen for diabetes follow-up ~6 months. Last A1c was 9.8%. Has not been taking her Metformin because she ran out of refills. She is prescribed Metformin 850mg  daily. No side effects. - Hemoglobin A1c performed today was 9.5% - Will restart Metformin 850mg  daily - Urine microalbumin performed today - Diabetic foot exam performed today was normal - Ophthalmology referral placed for annual eye exam - Follow-up with PCP in 3 months

## 2016-03-09 LAB — MICROALBUMIN, URINE: Microalb, Ur: 1.1 mg/dL

## 2016-03-14 ENCOUNTER — Other Ambulatory Visit: Payer: Self-pay | Admitting: Family Medicine

## 2016-03-14 DIAGNOSIS — J209 Acute bronchitis, unspecified: Secondary | ICD-10-CM

## 2016-03-14 MED ORDER — AZITHROMYCIN 250 MG PO TABS: ORAL_TABLET | ORAL | Status: DC

## 2016-03-14 NOTE — Telephone Encounter (Signed)
Patient called to say her children lost her azithromycin.  Need to get another prescription sent to pharmacy

## 2016-03-14 NOTE — Telephone Encounter (Signed)
Patient is aware that script has been resent to pharmacy. Julie [name],CMA

## 2016-03-14 NOTE — Telephone Encounter (Signed)
New prescription sent to the pharmacy.

## 2016-03-14 NOTE — Telephone Encounter (Signed)
Please let Ms. Rangel Loyola know that I have sent a new prescription for Azithromycin into the pharmacy. Thank you!

## 2016-03-14 NOTE — Telephone Encounter (Signed)
Will forward to MD to see if another script can be called into the pharmacy. Rylyn Zawistowski,CMA

## 2016-06-14 ENCOUNTER — Ambulatory Visit: Payer: Self-pay | Admitting: Family Medicine

## 2016-06-14 ENCOUNTER — Ambulatory Visit (INDEPENDENT_AMBULATORY_CARE_PROVIDER_SITE_OTHER): Payer: Self-pay | Admitting: Family Medicine

## 2016-06-14 ENCOUNTER — Encounter: Payer: Self-pay | Admitting: Family Medicine

## 2016-06-14 VITALS — BP 116/76 | HR 82 | Temp 98.2°F | Ht 65.0 in | Wt 200.8 lb

## 2016-06-14 DIAGNOSIS — J209 Acute bronchitis, unspecified: Secondary | ICD-10-CM

## 2016-06-14 DIAGNOSIS — J4541 Moderate persistent asthma with (acute) exacerbation: Secondary | ICD-10-CM

## 2016-06-14 MED ORDER — AZITHROMYCIN 250 MG PO TABS: ORAL_TABLET | ORAL | Status: DC

## 2016-06-14 MED ORDER — PREDNISONE 50 MG PO TABS: 50.0000 mg | ORAL_TABLET | Freq: Every day | ORAL | Status: DC

## 2016-06-14 NOTE — Assessment & Plan Note (Signed)
Consistent with persistent bronchitis, recurrent episode similar to 02/2016 however did fully resolve from last flare, worse in setting of asthma - Afebrile, no focal signs of infection, lung and throat clear (no suspicion for strep)  Plan: 1. Start Azithromycin Z-pak dosing 500mg  then 250mg  daily x 4 days 2. Treat underlying asthma exac 3. Return criteria given

## 2016-06-14 NOTE — Patient Instructions (Signed)
Thank you for coming in to clinic today.  1. It sounds like you are having an asthma flare with bronchitis, similar to before 2. Start with Azithromycin Z-pak - take 2 tablets day 1 then take 1 tablet daily for next 4 days 3. Take Prednisone 1 tablet daily for next 5 days, take with food 4. Drink plenty of water stay well hydrated 5. May take Tylenol / ibuprofen as needed for discomfort with coughing 6. Continue Dulera as prescribed  Please schedule a follow-up appointment with Dr Jerline Pain to follow-up Asthma within next 1-3 months, discuss options to help prevent recurrent flares and consider referral to Pulmonology if needed  If you have any other questions or concerns, please feel free to call the clinic to contact me. You may also schedule an earlier appointment if necessary.  However, if your symptoms get significantly worse, please go to the Emergency Department to seek immediate medical attention.  Nobie Putnam, [place]

## 2016-06-14 NOTE — Assessment & Plan Note (Addendum)
Consistent with mild acute asthma exacerbation in setting of mod persistent asthma, likely triggered acute bronchitis. Previously moderately controlled however not on maintenance regimen, using Dulera PRN, also allergic to albuterol has been on xopenex before but can't afford. Concern with never smoker, no known COPD, last PFTs 10/2014 [facility] Spirometry with normal lung function, unclear etiology for chronic cough and asthma symptoms. - Last similar episode exacerbation 02/2016 resolved with Z-pak - POx 96% on room air  Plan: 1. Start Prednisone burst 50mg  daily x 5 days 2. Azithromycin Z-pak for bronchitis likely trigger 3. Continue Dulera, ideally would be on xopenex PRN but limited by cost 4. Return criteria given for acute follow-up vs anticipate return within next few months to discuss future asthma maintenance / further work-up, may need pulm referral and repeat PFTs

## 2016-06-14 NOTE — Progress Notes (Signed)
Subjective:    Patient ID: Julie [name], female    DOB: 12-Jun-1981, 35 y.o.   MRN: [id number]  Robin Butts is a 35 y.o. female presenting on 06/14/2016 for Cough    Patient presents for a same day appointment.   HPI  ACUTE BRONCHITIS / ASTHMA EXAC: - Known history moderate persistent asthma with concerns of recurrent bronchitis, never diagnosed with COPD, never smoker and no second hand smoke. No other exposures. Prior history PFTs - Last seen for similar problem 02/2016, diagnosed at that time with acute bronchitis given azithromycin z-pak at that time, without wheezing and thought asthma exac less likely, cough at that time was present for x 2 months - Today patient reports that after last visit her cough completely resolved on the Z-pak antibiotic. Now she presents with recurrent similar symptoms, occasionally productive cough and difficulty taking full deep breath, cough seems to be worsening at night, worsening cough is associated with tenderness in chest wall and mid back - Admits productive cough, fevers/chills, nausea x 1 (resolved), bilateral mid back pain - Denies any vomiting, headache, chest pain, shortness of breath  PMH: DM2  Social History  Substance Use Topics  . Smoking status: Never Smoker   . Smokeless tobacco: Never Used  . Alcohol Use: No    Review of Systems Per HPI unless specifically indicated above     Objective:    BP 116/76 mmHg  Pulse 82  Temp(Src) 98.2 F (36.8 C) (Oral)  Ht 5\' 5"  (1.651 m)  Wt 200 lb 12.8 oz (91.082 kg)  BMI 33.41 kg/m2  SpO2 96%  Wt Readings from Last 3 Encounters:  06/14/16 200 lb 12.8 oz (91.082 kg)  03/08/16 194 lb 4.8 oz (88.134 kg)  10/15/15 193 lb 8 oz (87.771 kg)    Physical Exam  Constitutional: She appears well-developed and well-nourished. No distress.  Well-appearing and non-toxic, comfortable, cooperative  HENT:  Head: Normocephalic and atraumatic.  Mouth/Throat: Oropharynx is clear and  moist.  Cardiovascular: Normal rate, regular rhythm, normal heart sounds and intact distal pulses.   No murmur heard. Pulmonary/Chest:  No respiratory distress. Speaks full sentences, only occasional cough. Mildly reduced air movement bilateral some reduced resp effort, diffuse mild coarse breath sounds with some scattered exp wheezes, no focal crackles.  Musculoskeletal: She exhibits no edema.  Neurological: She is alert.  Skin: Skin is warm and dry. She is not diaphoretic.  Nursing note and vitals reviewed.  Results for orders placed or performed in visit on 03/08/16  Microalbumin, urine  Result Value Ref Range   Microalb, Ur 1.1 Not estab mg/dL  POCT glycosylated hemoglobin (Hb A1C)  Result Value Ref Range   Hemoglobin A1C 9.5       Assessment & Plan:   Problem List Items Addressed This Visit    Asthma with acute exacerbation - Primary    Consistent with mild acute asthma exacerbation in setting of mod persistent asthma, likely triggered acute bronchitis. Previously moderately controlled however not on maintenance regimen, using Dulera PRN, also allergic to albuterol has been on xopenex before but can't afford. Concern with never smoker, no known COPD, last PFTs 10/2014 [facility] Spirometry with normal lung function, unclear etiology for chronic cough and asthma symptoms. - Last similar episode exacerbation 02/2016 resolved with Z-pak - POx 96% on room air  Plan: 1. Start Prednisone burst 50mg  daily x 5 days 2. Azithromycin Z-pak for bronchitis likely trigger 3. Continue Dulera, ideally would be on xopenex PRN but  limited by cost 4. Return criteria given for acute follow-up vs anticipate return within next few months to discuss future asthma maintenance / further work-up, may need pulm referral and repeat PFTs      Relevant Medications   predniSONE (DELTASONE) 50 MG tablet   Acute bronchitis    Consistent with persistent bronchitis, recurrent episode similar to 02/2016 however did  fully resolve from last flare, worse in setting of asthma - Afebrile, no focal signs of infection, lung and throat clear (no suspicion for strep)  Plan: 1. Start Azithromycin Z-pak dosing 500mg  then 250mg  daily x 4 days 2. Treat underlying asthma exac 3. Return criteria given       Relevant Medications   azithromycin (ZITHROMAX Z-PAK) 250 MG tablet      Meds ordered this encounter  Medications  . azithromycin (ZITHROMAX Z-PAK) 250 MG tablet    Sig: Take 2 tabs (500mg  total) on Day 1. Take 1 tab (250mg ) daily for next 4 days.    Dispense:  6 tablet    Refill:  0  . predniSONE (DELTASONE) 50 MG tablet    Sig: Take 1 tablet (50 mg total) by mouth daily with breakfast.    Dispense:  5 tablet    Refill:  0      Follow up plan: Return in about 2 weeks (around 06/28/2016), or if symptoms worsen or fail to improve, for asthma flare / bronchitis.  Nobie Putnam, [place], PGY-3

## 2016-07-13 ENCOUNTER — Encounter: Payer: Self-pay | Admitting: Internal Medicine

## 2016-07-13 ENCOUNTER — Ambulatory Visit (INDEPENDENT_AMBULATORY_CARE_PROVIDER_SITE_OTHER): Payer: Self-pay | Admitting: Internal Medicine

## 2016-07-13 VITALS — BP 117/79 | HR 81 | Temp 98.6°F | Wt 202.0 lb

## 2016-07-13 DIAGNOSIS — J309 Allergic rhinitis, unspecified: Secondary | ICD-10-CM

## 2016-07-13 DIAGNOSIS — R0600 Dyspnea, unspecified: Secondary | ICD-10-CM

## 2016-07-13 DIAGNOSIS — J310 Chronic rhinitis: Secondary | ICD-10-CM | POA: Insufficient documentation

## 2016-07-13 DIAGNOSIS — L02224 Furuncle of groin: Secondary | ICD-10-CM

## 2016-07-13 MED ORDER — FLUTICASONE PROPIONATE 50 MCG/ACT NA SUSP: 2.0000 | Freq: Every day | NASAL | 0 refills | Status: DC

## 2016-07-13 NOTE — Patient Instructions (Signed)
Let's try Flonase for your congestion.   Let's get a chest x ray   Follow up in clinic 2-3 weeks if symptoms do not improve

## 2016-07-13 NOTE — Assessment & Plan Note (Signed)
Nasal Congestion: Allergic vs Nonallergic Rhinitis - does not seem to have a specific trigger from history. Patient does have a history of asthma.  - will try trial of Flonase  - follow up in 2-3 weeks if symptoms do not improve

## 2016-07-13 NOTE — Progress Notes (Signed)
[facility] Phone: [phone]   Date of Visit: 07/13/2016   HPI: Patient is here for same day visit:   Nasal Congestion: - reports that patient was treated for bronchitis in end of June with Z-pack as well as possible asthma exacerbation with prednisone burst. Reports some symptoms have improved but there some lingering symptoms such as the following - nasal congestion for the past three weeks with black/brown phlegm  - denies cough  - believes her phlegm is not from her lungs but her upper respiratory tract - reports she feels that she cannot take a deep breath in (her body is not allowing her to do this); also reports of left sided chest and bilateral thoracic back discomfort with deep inspiration. She states he feels this way regardless of breathing through nose or mouth. - no fevers or chills, no sore throat - no recent travel - no history of smoking - has reported history of asthma. She was placed on [facility] but she reports she felt jittery so she does not take this medication anymore and reports it is also expensive. Is allergic to Albuterol so placed on Xoponex but reports this medication is too expensive.  - no long car rides or plane rides, no LE swelling (does have varicose veins pt reports) - symptoms do not worsen when outside  ?Boil on Mons Pubis:  - reports she usually gets small pimples in this area due to shaving - current boil for about 1 week and is getting bitter - painful to touch  - no drainage; she tried to use a needle to puncture it to try to drain but no puss was expressed  - no fevers, or chills.   ROS: See HPI.  [place]:  Asthma   PHYSICAL EXAM: BP 117/79   Pulse 81   Temp 98.6 F (37 C) (Oral)   Wt 202 lb (91.6 kg)   LMP 07/09/2016   SpO2 98%   BMI 33.61 kg/m  GEN: NAD HEENT: Atraumatic, normocephalic, neck supple, EOMI, sclera clear; Nasal turbinates slightly swollen and erythematous. OP with minimal erythema otherwise  unremarkable.   CV: RRR, no murmurs, rubs, or gallops PULM: CTAB, normal effort MSK: no tenderness to palpation of chest or back  GU: Mons Pubis: ~0.7 cm indurated area on the left upper mons pubis without fluctuance or overlying erythema or increased warmth.  SKIN: No rash or cyanosis; warm and well-perfused EXTR: No lower extremity edema or calf tenderness PSYCH: Mood and affect euthymic, normal rate and volume of speech NEURO: Awake, alert, no focal deficits grossly, normal speech;  ASSESSMENT/PLAN:   Rhinitis Nasal Congestion: Allergic vs Nonallergic Rhinitis - does not seem to have a specific trigger from history. Patient does have a history of asthma.  - will try trial of Flonase  - follow up in 2-3 weeks if symptoms do not improve  Dyspnea Does not look distressed or have increased work of breathing on exam. Her oxygen saturation is normal on room air. Her lungs are clear to auscultation. This is possibly due to her asthma. She reports she is unable to afford short acting beta agonist inhaler. reports she feels jittery with [facility] and also is too expensive. In the setting of this black/brown sputum production, will obtain a CXR for further evaluation.  Follow up in 2-3 weeks if symptoms do not improve.   Furuncle on Mons Pubis: ~0.7 induration without fluctuance and without other signs of infection. Antibiotic currently not indicated - recommended warm  compresses  - return to clinic if symptoms worsen or do not resolve  Smiley Houseman, MD PGY [place]

## 2016-07-13 NOTE — Assessment & Plan Note (Signed)
Does not look distressed or have increased work of breathing on exam. Her oxygen saturation is normal on room air. Her lungs are clear to auscultation. This is possibly due to her asthma. She reports she is unable to afford short acting beta agonist inhaler. reports she feels jittery with [facility] and also is too expensive. In the setting of this black/brown sputum production, will obtain a CXR for further evaluation.

## 2016-07-17 ENCOUNTER — Encounter: Payer: Self-pay | Admitting: Internal Medicine

## 2016-07-17 ENCOUNTER — Ambulatory Visit (HOSPITAL_COMMUNITY)
Admission: RE | Admit: 2016-07-17 | Discharge: 2016-07-17 | Disposition: A | Discharge status: Home / Self Care | Payer: Self-pay | Source: Ambulatory Visit | Attending: Family Medicine | Admitting: Family Medicine

## 2016-07-17 DIAGNOSIS — R0600 Dyspnea, unspecified: Secondary | ICD-10-CM | POA: Insufficient documentation

## 2016-08-02 ENCOUNTER — Ambulatory Visit (INDEPENDENT_AMBULATORY_CARE_PROVIDER_SITE_OTHER): Payer: Self-pay | Admitting: Internal Medicine

## 2016-08-02 ENCOUNTER — Encounter: Payer: Self-pay | Admitting: Internal Medicine

## 2016-08-02 VITALS — BP 131/76 | HR 81 | Temp 98.3°F | Wt 199.0 lb

## 2016-08-02 DIAGNOSIS — J309 Allergic rhinitis, unspecified: Secondary | ICD-10-CM

## 2016-08-02 DIAGNOSIS — J45909 Unspecified asthma, uncomplicated: Secondary | ICD-10-CM

## 2016-08-02 DIAGNOSIS — R220 Localized swelling, mass and lump, head: Secondary | ICD-10-CM

## 2016-08-02 DIAGNOSIS — E119 Type 2 diabetes mellitus without complications: Secondary | ICD-10-CM

## 2016-08-02 DIAGNOSIS — R21 Rash and other nonspecific skin eruption: Secondary | ICD-10-CM

## 2016-08-02 MED ORDER — FLUTICASONE PROPIONATE 50 MCG/ACT NA SUSP: 2.0000 | Freq: Every day | NASAL | 0 refills | Status: DC

## 2016-08-02 MED ORDER — METFORMIN HCL 500 MG PO TABS: 500.0000 mg | ORAL_TABLET | Freq: Every day | ORAL | 1 refills | Status: DC

## 2016-08-02 MED ORDER — MONTELUKAST SODIUM 10 MG PO TABS: 10.0000 mg | ORAL_TABLET | Freq: Every day | ORAL | 3 refills | Status: DC

## 2016-08-02 NOTE — Patient Instructions (Signed)
Hulen Skains,  Por favor tome singulair una vez al da. Tambin puede tratar de aumentar la flonase 2 puffs dos veces al da, asegurndose de apuntar lejos del puente nasal. Prueba dulera 2 bocanadas [place].  Si los sntomas no mejoran, lo referir al [employer] en el odo y la garganta.  Por favor, haga una cita en alrededor de 2 semanas para comprobar la funcin renal y azcar en la sangre y ver si los sntomas nasales estn mejorando.  Mejor, Dr. Ola Spurr  Ms. [facility],   Please take singulair once daily. You may also try increasing flonase 2 puffs twice a day, being sure to aim away from nasal bridge. Try dulera 2 puffs twice a day, as well.  If symptoms do not improve, I will refer you to ear-nose-and-throat specialist.  Please make an appointment in about 2 weeks to check kidney function and blood sugars and see if nasal symptoms are improving.  Best, Dr. Ola Spurr

## 2016-08-02 NOTE — Progress Notes (Signed)
[facility] Progress Note  Subjective:  Julie [name] is a 35-y/o female who presents for follow-up of breathing issue. PMH significant for asthma. Visit assisted by video interpreter Allena Katz [phone]).   Nasal congestion: - Has had trouble breathing x 2 months - Feels like air does not get through her nostrils and has associated chest tightness. Feels she is not able to have full deep breaths.  - Flonase has not helped. She has been using daily but has been spraying upwards not outwards. Afrin has helped more but effect is very short-lived. - No longer has productive cough or dark sputum - Chest xray 07/17/16 not suggestive of PNA - Says dulera and xopenex together has worked great for sensation of chest tightness in the past but is not able to afford the xopenex. (Albuterol gave hives). Does not feel dulera works well by itself.  - Has orange card but unsure if she is still enrolled in MAP - Also mentions an area under the left side of her rib cage that occasionally protrudes and feels like there are air bubbles when she presses it ROS: no fevers or chills, no rhinorrhea  T2DM: - Has only been taking metformin 500 mg daily - Concerned that taking a higher dose will hurt kidneys  Social: former smoker   Objective: Blood pressure 131/76, pulse 81, temperature 98.3 F (36.8 C), temperature source Oral, weight 199 lb (90.3 kg), last menstrual period 07/15/2016. Constitutional: Well appearing female, in NAD HENT: TMs normal bilaterally, nasal turbinates swollen and erythematous, no nasal discharge; oropharynx normal; no maxillary or frontal sinus tenderness to percussion Cardiovascular: RRR, S1, S2, no m/r/g.  Pulmonary/Chest: Effort normal and breath sounds normal. No respiratory distress.  Abdominal: Soft. +BS, NT, ND, no rebound or guarding. No masses.  Psychiatric: Normal mood and affect.  Vitals reviewed  Assessment/Plan: Nasal swelling - Swollen nasal  turbinates and chest tightness concerning for poorly controlled asthma - For rhinitis, recommended starting singulair and continuing flonase (pointing away from septum) - Also recommended continuing dulera (pt says she has 5 inhalers left at home) - Provided coupons from goodrx.com for singulair and information on MAP  - Consider referral to ENT if no improvement with singulair  Diabetes mellitus type 2, uncomplicated - Refilled metformin 500 mg prescription - Will need hgb A1c and electrolytes checked at follow-up   Follow-up in 2 weeks to follow-up nasal symptoms. Also due for hgb A1c check and kidney function (lab was closed by end of today's visit).   Olene Floss, MD [place], PGY-2

## 2016-08-03 DIAGNOSIS — R220 Localized swelling, mass and lump, head: Secondary | ICD-10-CM | POA: Insufficient documentation

## 2016-08-03 NOTE — Assessment & Plan Note (Signed)
-   Refilled metformin 500 mg prescription - Will need hgb A1c and electrolytes checked at follow-up

## 2016-08-03 NOTE — Assessment & Plan Note (Signed)
-   Swollen nasal turbinates and chest tightness concerning for poorly controlled asthma - For rhinitis, recommended starting singulair and continuing flonase (pointing away from septum) - Also recommended continuing dulera (pt says she has 5 inhalers left at home) - Provided coupons from goodrx.com for singulair and information on MAP  - Consider referral to ENT if no improvement with singulair

## 2016-09-29 ENCOUNTER — Other Ambulatory Visit: Payer: Self-pay | Admitting: *Deleted

## 2016-09-29 ENCOUNTER — Other Ambulatory Visit: Payer: Self-pay | Admitting: Family Medicine

## 2016-09-29 DIAGNOSIS — R21 Rash and other nonspecific skin eruption: Secondary | ICD-10-CM

## 2016-09-29 MED ORDER — METFORMIN HCL 500 MG PO TABS: 500.0000 mg | ORAL_TABLET | Freq: Every day | ORAL | 1 refills | Status: DC

## 2016-09-29 NOTE — Telephone Encounter (Signed)
Rx filled. Patient needs office visit.  Julie [name]. Jerline Pain, [place] Resident PGY-3 09/29/2016 9:02 AM

## 2016-10-02 NOTE — Telephone Encounter (Signed)
Vm full and I was unable to leave one.  Mailed letter asking patient to call our office and schedule a diabetes follow up. Jazmin Hartsell,CMA

## 2017-01-24 ENCOUNTER — Other Ambulatory Visit: Payer: Self-pay | Admitting: Internal Medicine

## 2017-01-24 ENCOUNTER — Other Ambulatory Visit: Payer: Self-pay | Admitting: Family Medicine

## 2017-01-24 DIAGNOSIS — R21 Rash and other nonspecific skin eruption: Secondary | ICD-10-CM

## 2017-01-26 NOTE — Telephone Encounter (Signed)
Rx filled. Patient will need office appointment.   Julie [name]. Jerline Pain, [place] Resident PGY-3 01/26/2017 8:35 AM

## 2017-01-26 NOTE — Telephone Encounter (Signed)
Patient currently doesn't have insurance or financial assistance.  Will send message to Kennyth Lose to have her call patient to schedule for this once she has an opening. Jazmin Hartsell,CMA

## 2017-06-06 ENCOUNTER — Inpatient Hospital Stay (HOSPITAL_COMMUNITY): Payer: Self-pay

## 2017-06-06 ENCOUNTER — Encounter (HOSPITAL_COMMUNITY): Payer: Self-pay

## 2017-06-06 ENCOUNTER — Inpatient Hospital Stay (HOSPITAL_COMMUNITY)
Admission: AD | Admit: 2017-06-06 | Discharge: 2017-06-06 | Disposition: A | Discharge status: Home / Self Care | Payer: Medicaid Other | Source: Ambulatory Visit | Attending: Obstetrics and Gynecology | Admitting: Obstetrics and Gynecology

## 2017-06-06 DIAGNOSIS — E669 Obesity, unspecified: Secondary | ICD-10-CM | POA: Insufficient documentation

## 2017-06-06 DIAGNOSIS — Z888 Allergy status to other drugs, medicaments and biological substances status: Secondary | ICD-10-CM | POA: Insufficient documentation

## 2017-06-06 DIAGNOSIS — O99211 Obesity complicating pregnancy, first trimester: Secondary | ICD-10-CM | POA: Insufficient documentation

## 2017-06-06 DIAGNOSIS — R102 Pelvic and perineal pain: Secondary | ICD-10-CM | POA: Insufficient documentation

## 2017-06-06 DIAGNOSIS — Z8249 Family history of ischemic heart disease and other diseases of the circulatory system: Secondary | ICD-10-CM | POA: Insufficient documentation

## 2017-06-06 DIAGNOSIS — Z833 Family history of diabetes mellitus: Secondary | ICD-10-CM | POA: Insufficient documentation

## 2017-06-06 DIAGNOSIS — N898 Other specified noninflammatory disorders of vagina: Secondary | ICD-10-CM

## 2017-06-06 DIAGNOSIS — Z87891 Personal history of nicotine dependence: Secondary | ICD-10-CM | POA: Insufficient documentation

## 2017-06-06 DIAGNOSIS — Z7984 Long term (current) use of oral hypoglycemic drugs: Secondary | ICD-10-CM | POA: Insufficient documentation

## 2017-06-06 DIAGNOSIS — O24911 Unspecified diabetes mellitus in pregnancy, first trimester: Secondary | ICD-10-CM

## 2017-06-06 DIAGNOSIS — Z79899 Other long term (current) drug therapy: Secondary | ICD-10-CM | POA: Insufficient documentation

## 2017-06-06 DIAGNOSIS — O26891 Other specified pregnancy related conditions, first trimester: Secondary | ICD-10-CM | POA: Insufficient documentation

## 2017-06-06 DIAGNOSIS — Z3A01 Less than 8 weeks gestation of pregnancy: Secondary | ICD-10-CM | POA: Insufficient documentation

## 2017-06-06 DIAGNOSIS — R21 Rash and other nonspecific skin eruption: Secondary | ICD-10-CM

## 2017-06-06 HISTORY — DX: Type 2 diabetes mellitus without complications: E11.9

## 2017-06-06 LAB — URINALYSIS, ROUTINE W REFLEX MICROSCOPIC
BILIRUBIN URINE: NEGATIVE
GLUCOSE, UA: 50 mg/dL — AB
HGB URINE DIPSTICK: NEGATIVE
KETONES UR: NEGATIVE mg/dL
Leukocytes, UA: NEGATIVE
Nitrite: NEGATIVE
PROTEIN: NEGATIVE mg/dL
Specific Gravity, Urine: 1.026 (ref 1.005–1.030)
pH: 5 (ref 5.0–8.0)

## 2017-06-06 LAB — POCT PREGNANCY, URINE: Preg Test, Ur: POSITIVE — AB

## 2017-06-06 LAB — WET PREP, GENITAL
CLUE CELLS WET PREP: NONE SEEN
Sperm: NONE SEEN
Trich, Wet Prep: NONE SEEN
Yeast Wet Prep HPF POC: NONE SEEN

## 2017-06-06 LAB — ABO/RH: ABO/RH(D): O POS

## 2017-06-06 MED ORDER — METFORMIN HCL 500 MG PO TABS: 500.0000 mg | ORAL_TABLET | Freq: Every day | ORAL | 3 refills | Status: DC

## 2017-06-06 NOTE — MAU Note (Signed)
Patient presents with left lower abdominal apin for the past 5 days. Patient took a hpt and it was +. Patient denies and vaginal bleeding. Patient is also having vaginal itching for the past 3 weeks. Patient states she was diagnosed with a vaginal infection and was given medication that she finished Sunday night.
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
## 2017-06-07 LAB — GC/CHLAMYDIA PROBE AMP (~~LOC~~) NOT AT ARMC
Chlamydia: NEGATIVE
Neisseria Gonorrhea: NEGATIVE

## 2017-06-14 ENCOUNTER — Ambulatory Visit: Payer: Self-pay | Admitting: *Deleted

## 2017-06-14 ENCOUNTER — Encounter: Payer: Medicaid Other | Attending: Obstetrics & Gynecology | Admitting: *Deleted

## 2017-06-14 ENCOUNTER — Telehealth: Payer: Self-pay | Admitting: Family Medicine

## 2017-06-14 DIAGNOSIS — Z029 Encounter for administrative examinations, unspecified: Secondary | ICD-10-CM | POA: Insufficient documentation

## 2017-06-14 NOTE — Progress Notes (Signed)
  Patient was seen on 06/14/2017 for Diabetes during pregnancy self-management. She states history of GDM with last pregnancy 8 years ago. She states no diabetes prior to this pregnancy but then stated she has been on 850 mg Metformin for over a year prior to this pregnancy. Diet history was taken. She also voiced concerns over being able to pay for visits here as she has no insurance. The following learning objectives were met by the patient :   States the definition of Diabetes during pregnancy  States why dietary management is important in controlling blood glucose  Describes the effects of carbohydrates on blood glucose levels  Demonstrates ability to create a balanced meal plan  Demonstrates carbohydrate counting   States when to check blood glucose levels  Demonstrates proper blood glucose monitoring techniques  States the effect of stress and exercise on blood glucose levels  States the importance of limiting caffeine and abstaining from alcohol and smoking  Acknowledges that there are several medication options for controlling diabetes during pregnancy including insulin.   [place] form provided to patient to bring to her next appointment. She states she already filled out this form at her previous OB office and that the information should be in her chart.   Plan:  Aim for 3 Carb Choices per meal (45 grams) +/- 1 either way  Aim for 1-2 Carbs per snack Begin reading food labels for Total Carbohydrate of foods Consider  increasing your activity level by walking or other activity daily as tolerated Begin checking BG before breakfast and 2 hours after first bite of breakfast, lunch and dinner as directed by MD  Bring Log Book to every medical appointment   Take medication if directed by MD  Blood glucose monitor given: [employer] Lot # [id number] Exp: 04/07/2019 Blood glucose reading: 221 mg/dl 90 minutes after her last meal  Patient instructed to monitor glucose  levels: FBS: 60 - 95 mg/dl 2 hour: <120 mg/dl  Patient received the following handouts:  Nutrition Diabetes and Pregnancy  Carbohydrate Counting List  Patient will be seen for follow-up as needed.

## 2017-06-14 NOTE — Telephone Encounter (Signed)
[facility]. Patient very confused due to receiving calls from both [place] and [facility] stating that she had appointments here and there. Stanton Kidney confirmed that patient was only seen for screening and was not seen for any care, and that she would be considered as New ob and no records are to be sent.

## 2017-07-02 ENCOUNTER — Other Ambulatory Visit: Payer: Self-pay | Admitting: Obstetrics and Gynecology

## 2017-07-02 ENCOUNTER — Encounter: Payer: Self-pay | Admitting: Obstetrics and Gynecology

## 2017-07-02 ENCOUNTER — Ambulatory Visit: Payer: Self-pay

## 2017-07-02 ENCOUNTER — Ambulatory Visit (INDEPENDENT_AMBULATORY_CARE_PROVIDER_SITE_OTHER): Payer: Medicaid Other | Admitting: Obstetrics and Gynecology

## 2017-07-02 VITALS — BP 107/69 | HR 79 | Wt 196.8 lb

## 2017-07-02 DIAGNOSIS — Z1151 Encounter for screening for human papillomavirus (HPV): Secondary | ICD-10-CM

## 2017-07-02 DIAGNOSIS — O0991 Supervision of high risk pregnancy, unspecified, first trimester: Secondary | ICD-10-CM | POA: Diagnosis not present

## 2017-07-02 DIAGNOSIS — O24919 Unspecified diabetes mellitus in pregnancy, unspecified trimester: Secondary | ICD-10-CM | POA: Insufficient documentation

## 2017-07-02 DIAGNOSIS — O0990 Supervision of high risk pregnancy, unspecified, unspecified trimester: Secondary | ICD-10-CM | POA: Insufficient documentation

## 2017-07-02 DIAGNOSIS — O34219 Maternal care for unspecified type scar from previous cesarean delivery: Secondary | ICD-10-CM | POA: Insufficient documentation

## 2017-07-02 DIAGNOSIS — O3680X0 Pregnancy with inconclusive fetal viability, not applicable or unspecified: Secondary | ICD-10-CM

## 2017-07-02 DIAGNOSIS — Z98891 History of uterine scar from previous surgery: Secondary | ICD-10-CM

## 2017-07-02 DIAGNOSIS — O24911 Unspecified diabetes mellitus in pregnancy, first trimester: Secondary | ICD-10-CM | POA: Diagnosis not present

## 2017-07-02 DIAGNOSIS — Z124 Encounter for screening for malignant neoplasm of cervix: Secondary | ICD-10-CM

## 2017-07-02 DIAGNOSIS — O09521 Supervision of elderly multigravida, first trimester: Secondary | ICD-10-CM

## 2017-07-02 DIAGNOSIS — O09529 Supervision of elderly multigravida, unspecified trimester: Secondary | ICD-10-CM | POA: Insufficient documentation

## 2017-07-02 LAB — POCT URINALYSIS DIP (DEVICE)
BILIRUBIN URINE: NEGATIVE
GLUCOSE, UA: NEGATIVE mg/dL
Hgb urine dipstick: NEGATIVE
KETONES UR: NEGATIVE mg/dL
LEUKOCYTES UA: NEGATIVE
Nitrite: NEGATIVE
PROTEIN: NEGATIVE mg/dL
SPECIFIC GRAVITY, URINE: 1.025 (ref 1.005–1.030)
Urobilinogen, UA: 0.2 mg/dL (ref 0.0–1.0)
pH: 5.5 (ref 5.0–8.0)

## 2017-07-02 NOTE — Patient Instructions (Signed)
Diagnstico de diabetes mellitus gestacional (Gestational Diabetes Mellitus, Diagnosis) La diabetes gestacional (diabetes mellitus gestacional) es una forma temporal de diabetes que algunas mujeres desarrollan durante el Media planner. Generalmente, ocurre alrededor [place] 24 a la 28de gestacin y desaparece despus del parto. Los cambios hormonales durante el embarazo pueden interferir en la produccin y la actividad de la [place], lo que puede derivar en uno de estos problemas o en ambos:  El pncreas no produce la cantidad suficiente de una hormona llamada insulina.  Las clulas del cuerpo no responden de [place] a la insulina que el organismo produce (resistencia a la insulina). Normalmente, la insulina estimula el ingreso de la glucosa en las clulas del cuerpo. Las clulas usan la glucosa para Dealer. La resistencia a la insulina o la falta de esta hormona hace que el exceso de glucosa se acumule en la sangre, en lugar de ir a las clulas. Como resultado, aumenta la glucemia (hiperglucemia). CULES SON LOS RIESGOS? Si la diabetes gestacional se trata, hay pocas probabilidades de que ocasione problemas. Si no se la controla con tratamiento, puede causar problemas durante el [place] de parto y [place], y algunos de esos problemas pueden ser dainos para el feto y la [place]. La diabetes gestacional que no se controla tambin puede producir problemas respiratorios y bajo nivel de glucemia en el recin nacido. Las mujeres que tienen diabetes gestacional son ms propensas a Lawyer afeccin si se embarazan de nuevo y a tener diabetes tipo2 en el futuro. QU INCREMENTA EL RIESGO? Es ms probable que Orthoptist en las mujeres embarazadas que:  Son [place] de 24aos durante el [place].  Tienen antecedentes familiares de diabetes.  Tienen sobrepeso.  Tuvieron diabetes gestacional en el pasado.  Tienen sndrome de ovario poliqustico (SOP).  Tienen  un embarazo gemelar o un embarazo mltiple.  Son descendientes de indgenas norteamericanos, afroamericanos, hispanos o latinos, o asiticos o isleos del [place] LOS SIGNOS O LOS SNTOMAS? La mayora de las mujeres no perciben los sntomas de la diabetes gestacional porque son similares a los sntomas normales del Media planner. Los sntomas de la diabetes gestacional pueden incluir, entre otros:  Aumento de la sed (polidipsia).  Aumento del apetito (polifagia).  Aumento de la miccin (poliuria). [place] SE DIAGNOSTICA? Esta afeccin se puede diagnosticar en funcin del nivel de glucemia que puede medirse con uno o ms de los siguientes anlisis de sangre:  Medicin de la glucemia en [place]. No se le permitir comer (tendr que [employer]) durante al menos 8horas antes de que se tome una [place] de [place].  Prueba al azar de la glucemia. Esta prueba mide la glucemia en cualquier momento del da, sin importar cundo comi.  Prueba de tolerancia a la glucosa oral (PTGO). Generalmente, se realiza [place] [employer] 24 a la 28de gestacin. ? Para esta prueba, le harn una medicin de la glucemia en ayunas. Luego, tomar una bebida que contiene glucosa. Se analizar el nivel de glucemia nuevamente 1hora despus de tomar la bebida con glucosa (PTGO a la hora). ? Si el resultado de la PTGO a la hora es igual o superior a 140mg /dl (7,13mmol/l), le repetirn la PTGO. Esta vez, se analizar el nivel de glucemia 3horas despus de tomar la bebida con glucosa (PTGO a las 3horas). Si tiene factores de [place], pueden hacerle pruebas de deteccin de la diabetes tipo2 no diagnosticada en la primera visita de atencin Medical sales representative (visita prenatal). CMO SE TRATA ESTA AFECCIN?  El tratamiento puede estar a cargo de Research officer, trade union. Para tratar esta afeccin, debe seguir las indicaciones del mdico respecto de lo siguiente:  Comer una dieta ms saludable y  aumentar la actividad fsica. Estos cambios son lo ms importante para mantener la diabetes gestacional bajo control.  Controlarse la glucemia. Hgalo con la frecuencia que le hayan indicado.  Tomar los medicamentos para la diabetes o aplicarse la [employer]. Estos frmacos se recetarn solamente si son necesarios. ? Si [place] insulina, tal vez deba ajustar las dosis en funcin de la cantidad de actividad fsica que realiza y de los alimentos que consume. El mdico le indicar cmo hacerlo. El mdico [employer]state objetivos del tratamiento para usted sobre la base de la etapa del Media planner en la que se encuentra y de cualquier otra afeccin que padezca. Generalmente, el objetivo del tratamiento es [employer] siguientes niveles de glucemia durante el embarazo:  En ayunas: igual o menor que 95mg /dl (5,90mmol/l).  Despus de las comidas (posprandial): ? Una hora despus de una comida: igual o menor que 140mg /dl (7,78mmol/l). ? Dos horas despus de una comida: igual o menor que 120mg /dl (6,50mmol/l).  Nivel de A1c (hemoglobinaA1c): del 6% al 6,5%. SIGA ESTAS INDICACIONES EN SU CASA: Preguntas para hacerle al mdico Considere la posibilidad de hacer las siguientes preguntas:  Debo reunirme con Radio broadcast assistant para el cuidado de la diabetes?  Dnde puedo encontrar un grupo de apoyo para personas diabticas?  Qu equipos necesitar para controlar la diabetes en casa?  Qu medicamentos para la diabetes necesito y cundo debo tomarlos?  Con qu frecuencia debo controlarme la glucemia?  A qu nmero puedo llamar si tengo preguntas?  Cundo es mi prxima cita? Instrucciones generales  [employer] de venta libre y los recetados solamente como se lo haya indicado el mdico.  Controle el aumento de peso durante el [place]. La cantidad de [employer] se espera que aumente depende de su [place] (ndice de masa corporal) antes del embarazo.  Concurra a todas las  visitas de control como se lo haya indicado el mdico. Esto es importante.  Para obtener ms informacin sobre la diabetes, visite los siguientes sitios: ? Hotel manager de la Diabetes (American Diabetes Association, ADA): www.diabetes.org ? Asociacin Norteamericana de Instructores para el Cuidado de la Diabetes (American Association of Diabetes Educators, AADE): www.diabeteseducator.org/patient-resources COMUNQUESE CON UN MDICO SI:  El nivel de glucemia es igual o superior a 240mg /dl (13,72mmol/l).  El nivel de glucemia es igual o superior a 200mg /dl (11,42mmol/l), y tiene cetonas en la orina.  Ha estado enferma o ha tenido fiebre durante 2o ms das y no [place].  Tiene alguno de los siguientes problemas durante ms de 6horas: ? No puede comer ni beber. ? Tiene nuseas y vmitos. ? Tiene diarrea.  SOLICITE AYUDA DE INMEDIATO SI:  Su nivel de glucosa en la sangre est por debajo de 54mg /dl (20mmol/l).  Est confundida o tiene dificultad para pensar con claridad.  Tiene dificultad para respirar.  Tiene un nivel moderado o alto de cetonas en la [place].  El beb se mueve menos de lo habitual.  Tiene secreciones inusuales o sangrado de la vagina.  Comienza a tener contracciones antes de tiempo (prematuramente). Las contracciones se pueden sentir como un endurecimiento de la parte inferior del abdomen.  Esta informacin no tiene Marine scientist el consejo del mdico. Asegrese de hacerle al mdico cualquier pregunta que tenga. Document Released: 09/13/2005 Document Revised: 03/27/2016 Document Reviewed: 01/07/2016 Elsevier Interactive  Patient Education  2017 [place] trimestre de Media planner (First Trimester of Pregnancy) El primer trimestre de Media planner se extiende desde la semana1 hasta el final de la semana12 (mes1 al mes3). Una semana despus de que un espermatozoide fecunda un vulo, este se implantar en la pared uterina. Este embrin comenzar a  Medical laboratory scientific officer convertirse en un beb. Sus genes y los de su pareja forman el beb. Los genes del varn determinan si ser un nio o una nia. Entre la semana6 y [place], se forman los ojos y [place], y los latidos del corazn pueden verse en la ecografa. Al final de las 12semanas, todos los rganos del beb estn formados. Ahora que est embarazada, querr hacer todo lo que est a su alcance para tener un beb sano. Dos de las cosas ms importantes son Lucilla Edin buena atencin prenatal y seguir las indicaciones del mdico. La atencin prenatal incluye toda la asistencia mdica que usted recibe antes del nacimiento del beb. Esta ayudar a prevenir, Hydrographic surveyor y tratar cualquier problema durante el embarazo y [place]. CAMBIOS EN EL ORGANISMO Su organismo atraviesa por muchos cambios durante el [place], y estos varan de Ardelia Mems mujer a Theatre manager.  Al principio, puede aumentar o bajar algunos kilos.  Puede tener Higher education careers adviser (nuseas) y vomitar. Si no puede controlar los vmitos, llame al mdico.  Puede cansarse con facilidad.  Es posible que tenga dolores de cabeza que pueden aliviarse con los medicamentos que el mdico le permita tomar.  Puede orinar con mayor frecuencia. El dolor al orinar puede significar que usted tiene una infeccin de la vejiga.  Debido al [place], puede tener acidez estomacal.  Puede estar estreida, ya que ciertas hormonas enlentecen los movimientos de los msculos que [employer] desechos a travs de los intestinos.  Pueden aparecer hemorroides o abultarse e hincharse las venas (venas varicosas).  Las [employer] pueden empezar a Engineer, site y Scientist, forensic. Los pezones pueden sobresalir ms, y el tejido que los rodea (areola) tornarse ms oscuro.  Las Production manager y estar sensibles al cepillado y al hilo dental.  Pueden aparecer zonas oscuras o manchas (cloasma, mscara del Media planner) en el rostro que probablemente se atenuarn despus del nacimiento del  beb.  Los perodos menstruales se interrumpirn.  Tal vez no tenga apetito.  Puede sentir un fuerte deseo de consumir ciertos alimentos.  Puede tener cambios a Engineer, site a da, por ejemplo, por momentos puede estar emocionada por el Media planner y por otros preocuparse porque algo pueda salir mal con el embarazo o el beb.  Tendr sueos ms vvidos y extraos.  Tal vez haya cambios en el cabello que pueden incluir su engrosamiento, crecimiento rpido y cambios en la textura. A algunas mujeres tambin se les cae el cabello durante o despus del [place], o tienen el cabello seco o fino. Lo ms probable es que el cabello se le normalice despus del nacimiento del beb. QU DEBE ESPERAR EN LAS CONSULTAS PRENATALES Durante una visita prenatal de rutina:  La pesarn para asegurarse de que usted y el beb estn creciendo normalmente.  Le controlarn la presin arterial.  Le medirn el abdomen para controlar el desarrollo del beb.  Se escucharn los latidos cardacos a partir de la semana10 o la12 de embarazo, aproximadamente.  Se analizarn los resultados de los estudios solicitados en visitas anteriores. El mdico puede preguntarle:  Cmo se siente.  Si siente los movimientos del beb.  Si ha tenido [employer], como prdida de lquido, [place],  dolores de cabeza intensos o clicos abdominales.  Si est consumiendo algn producto que contenga tabaco, como cigarrillos, tabaco de Higher education careers adviser y Psychologist, sport and exercise.  Si tiene [employer]. Otros estudios que pueden realizarse durante el primer trimestre incluyen lo siguiente:  Anlisis de sangre para determinar el tipo de sangre y Hydrographic surveyor la presencia de infecciones previas. Adems, se los usar para controlar si los niveles de hierro son bajos (anemia) y Teacher, adult education los anticuerpos Rh. En una etapa ms avanzada del [place], se harn anlisis de sangre para saber si tiene diabetes, junto con otros estudios si  surgen problemas.  Anlisis de orina para detectar infecciones, diabetes o protenas en la orina.  Una ecografa para confirmar que el beb crece y se desarrolla correctamente.  Una amniocentesis para diagnosticar posibles problemas genticos.  Estudios del feto para descartar espina bfida y sndrome de [place].  Es posible que necesite otras pruebas adicionales.  Prueba del VIH (virus de inmunodeficiencia humana). Los exmenes prenatales de rutina incluyen la prueba de deteccin del VIH, a menos que decida no Radiation protection practitioner. INSTRUCCIONES PARA EL CUIDADO EN EL HOGAR Medicamentos:  Siga las indicaciones del mdico en relacin con el uso de medicamentos. Durante el embarazo, hay medicamentos que pueden tomarse y 93 que no.  Tome las vitaminas prenatales como se le indic.  Si est estreida, tome un laxante suave, si el mdico lo [place]. Dieta  Consuma alimentos balanceados. Elija alimentos variados, como carne o protenas de origen vegetal, pescado, leche y productos lcteos descremados, verduras, frutas y panes y Psychologist, prison and probation services. El mdico la ayudar a Office manager cantidad de peso que puede [place].  No coma carne cruda ni quesos sin cocinar. Estos elementos contienen bacterias que pueden causar defectos congnitos en el beb.  La ingesta diaria de cuatro o cinco comidas pequeas en lugar de tres comidas abundantes puede ayudar a [employer] nuseas y los vmitos. Si empieza a tener nuseas, comer algunas galletas saladas puede ser de [place]. Beber lquidos [employer] comidas en lugar de tomarlos durante las comidas tambin puede ayudar a Actor las nuseas y los vmitos.  Si est estreida, consuma alimentos con alto contenido de fibra, como verduras y frutas frescas, y Psychologist, prison and probation services. Beba suficiente lquido para Consulting civil engineer orina clara o de color amarillo plido. Actividad y Conservation officer, historic buildings ejercicio solamente como se lo haya indicado el mdico. El ejercicio la ayudar  a: ? Technical sales engineer. ? Mantenerse en forma. ? Estar preparada para el trabajo de parto y [place].  Los dolores, los clicos en la parte baja del abdomen o los calambres en la cintura son un buen indicio de que debe dejar de Insurance risk surveyor. Consulte al mdico antes de seguir haciendo ejercicios normales.  Intente no estar de pie [employer]. Mueva las piernas con frecuencia si debe estar de pie en un lugar durante mucho tiempo.  Evite levantar pesos [employer].  Use zapatos de tacones bajos y [place].  Puede seguir teniendo [employer], excepto que el mdico le indique lo contrario. Alivio del dolor o las molestias  Use un sostn que le brinde buen soporte si siente dolor a la palpacin [employer].  Dese baos de asiento con agua tibia para Best boy o las molestias causadas por las hemorroides. Use crema antihemorroidal si el mdico se lo permite.  Descanse con las piernas elevadas si tiene calambres o dolor de cintura.  Si tiene venas varicosas en las piernas, use medias de descanso. Eleve  los pies durante 27minutos, 3 o 4veces por da. Limite la cantidad de sal en su dieta. Cuidados prenatales  Programe las visitas prenatales para la semana12 de [place]. Generalmente se programan cada mes al principio y se hacen ms frecuentes en los 2 ltimos meses antes del parto.  Escriba sus preguntas. Llvelas cuando concurra a las visitas prenatales.  Concurra a todas las visitas prenatales como se lo haya indicado el mdico. Seguridad  Colquese el cinturn de seguridad cuando conduzca.  Haga una lista de los nmeros de telfono de Freight forwarder, que BJ's nmeros de telfono de familiares, [place], el hospital y los departamentos de polica y bomberos. Consejos generales  Pdale al mdico que la derive a clases de educacin prenatal en su localidad. Debe comenzar a tomar las clases antes de Dietitian en el mes6 de embarazo.  Pida  ayuda si tiene necesidades nutricionales o de asesoramiento Solicitor. El mdico puede aconsejarla o derivarla a especialistas para que la ayuden con diferentes necesidades.  No se d baos de inmersin en agua caliente, baos turcos ni saunas.  No se haga duchas vaginales ni use tampones o toallas higinicas perfumadas.  No mantenga las piernas cruzadas durante mucho tiempo.  Evite el contacto con las bandejas sanitarias de los gatos y la tierra que estos animales usan. Estos elementos contienen bacterias que pueden causar defectos congnitos al beb y la posible prdida del feto debido a un aborto espontneo o muerte fetal.  No fume, no consuma hierbas ni medicamentos que no hayan sido recetados por el mdico. Las sustancias qumicas que estos productos contienen afectan la formacin y el desarrollo del beb.  No consuma ningn producto que contenga tabaco, lo que incluye cigarrillos, tabaco de Higher education careers adviser y Psychologist, sport and exercise. Si necesita ayuda para dejar de fumar, consulte al [employer]. Puede recibir asesoramiento y otro tipo de recursos para dejar de fumar.  Programe una cita con el dentista. En su casa, lvese los dientes con un cepillo dental blando y psese el hilo dental con suavidad. SOLICITE ATENCIN MDICA SI:  Tiene mareos.  Siente clicos leves, presin en la pelvis o dolor persistente en el abdomen.  Tiene nuseas, vmitos o diarrea persistentes.  Tiene secrecin vaginal con mal olor.  Siente dolor al [employer].  Tiene el rostro, las [place], las piernas o los tobillos ms hinchados.  SOLICITE ATENCIN MDICA DE INMEDIATO SI:  Tiene fiebre.  Tiene una prdida de lquido por la vagina.  Tiene sangrado o pequeas prdidas vaginales.  Siente dolor intenso o clicos en el abdomen.  Sube o baja de peso rpidamente.  Vomita sangre de color rojo brillante o material que parezca granos de caf.  Ha estado expuesta a la rubola y no ha sufrido la enfermedad.  Ha  estado expuesta a la quinta enfermedad o a la varicela.  Tiene un dolor de cabeza intenso.  Le falta el aire.  Sufre cualquier tipo de traumatismo, por ejemplo, debido a una cada o un accidente automovilstico.  Esta informacin no tiene Marine scientist el consejo del mdico. Asegrese de hacerle al mdico cualquier pregunta que tenga. Document Released: 09/13/2005 Document Revised: 12/25/2014 Document Reviewed: 10/14/2013 Elsevier Interactive Patient Education  2017 [employer].

## 2017-07-02 NOTE — Progress Notes (Signed)
Pt informed that the ultrasound is considered a limited OB ultrasound and is not intended to be a complete ultrasound exam.  Patient also informed that the ultrasound is not being completed with the intent of assessing for fetal or placental anomalies or any pelvic abnormalities.  Explained that the purpose of today's ultrasound is to assess for viability.  Patient acknowledges the purpose of the exam and the limitations of the study.    Single IUP CRL - 4.29 cm (11w 1d) FHR - 170 bpm per M-mode Fetal movement present

## 2017-07-02 NOTE — Progress Notes (Signed)
Subjective:  Julie [name] is a 36 y.o. [phone] at 60w2dbeing seen today for first OB visit. EDD by LMP and confirmed by first trimester U/S. H/O DM. Was taking metformin 850 mg bid prior to pregnancy but has been out of her medication for a while. Was seen in [facility] and started back on Metformin 500 mg bid, referred to nutrition, which she has completed. Also given DM supplies. She has been taking her Metformin but has not been checking her BS.  She also has a H/O c section x 2.    She is currently monitored for the following issues for this high-risk pregnancy and has Obesity, Class II, BMI 35-39.9; Lipoma of lower extremity; Hyperlipidemia LDL goal < 160; Depression; Osteoarthritis; Elevated transaminase level; Knee meniscus pain; Diabetes mellitus type 2, uncomplicated ([place]; Periumbilical hernia; Supervision of high risk pregnancy, antepartum; Diabetes mellitus affecting pregnancy; AMA (advanced maternal age) multigravida 378+ and History of cesarean section on her problem list.  Patient reports no complaints.  Contractions: Not present. Vag. Bleeding: None.  Movement: Absent. Denies leaking of fluid.   The following portions of the patient's history were reviewed and updated as appropriate: allergies, current medications, past family history, past medical history, past social history, past surgical history and problem list. Problem list updated.  Objective:   Vitals:   07/02/17 0817  BP: 107/69  Pulse: 79  Weight: 196 lb 12.8 oz (89.3 kg)    Fetal Status:     Movement: Absent     General:  Alert, oriented and cooperative. Patient is in no acute distress.  Skin: Skin is warm and dry. No rash noted.   Cardiovascular: Normal heart rate noted  Respiratory: Normal respiratory effort, no problems with respiration noted  Abdomen: Soft, gravid, appropriate for gestational age. Pain/Pressure: Absent     Pelvic:  Cervical exam performed        Extremities: Normal range of motion.  Edema:  None  Mental Status: Normal mood and affect. Normal behavior. Normal judgment and thought content.  Breast sym supple no masses nipple d/c or adenopathy  Urinalysis:      Assessment and Plan:  Pregnancy: [id number] 112w2d1. Encounter to determine fetal viability of pregnancy, single or unspecified fetus Viability verified - US[place]B Limited  2. Supervision of high risk pregnancy, antepartum Prenatal labs and care reviewed with pt.  - POCT urinalysis dip (device) - Culture, OB Urine - US[place]oreaFM OB COMP + 14 WK; Future - Hemoglobinopathy Evaluation - Obstetric Panel, Including HIV - Hemoglobin A1c - Cytology - PAP  3. Diabetes mellitus affecting pregnancy in first trimester DM and pregnancy reviewed with pt. Importance of checking BS reviewed. - US[place]oreaFM OB COMP + 14 WK; Future - Hemoglobin A1c - Comp Met (CMET) - Protein / creatinine ratio, urine - TSH Referral to Opth  Fetal ECHO 24-28 weeks 4. Elderly multigravida in first trimester Reviewed and first trimester screen scheduled  5. History of cesarean section x 2 Will need repeat at 39 weeks  Preterm labor symptoms and general obstetric precautions including but not limited to vaginal bleeding, contractions, leaking of fluid and fetal movement were reviewed in detail with the patient. Please refer to After Visit Summary for other counseling recommendations.  Return in about 2 weeks (around 07/16/2017) for OB visit.   ErChancy MilroyMD

## 2017-07-02 NOTE — Addendum Note (Signed)
Addended by: Wendelyn Breslow L on: 07/02/2017 09:25 AM   Modules accepted: Orders

## 2017-07-03 LAB — CYTOLOGY - PAP
DIAGNOSIS: NEGATIVE
HPV: NOT DETECTED

## 2017-07-04 LAB — URINE CULTURE, OB REFLEX: ORGANISM ID, BACTERIA: NO GROWTH

## 2017-07-04 LAB — CULTURE, OB URINE

## 2017-07-06 LAB — OBSTETRIC PANEL, INCLUDING HIV
Antibody Screen: NEGATIVE
Basophils Absolute: 0 10*3/uL (ref 0.0–0.2)
Basos: 0 %
EOS (ABSOLUTE): 0.3 10*3/uL (ref 0.0–0.4)
EOS: 5 %
HIV SCREEN 4TH GENERATION: NONREACTIVE
Hematocrit: 37.1 % (ref 34.0–46.6)
Hemoglobin: 11.8 g/dL (ref 11.1–15.9)
Hepatitis B Surface Ag: NEGATIVE
IMMATURE GRANULOCYTES: 0 %
Immature Grans (Abs): 0 10*3/uL (ref 0.0–0.1)
LYMPHS ABS: 2 10*3/uL (ref 0.7–3.1)
Lymphs: 31 %
MCH: 28.3 pg (ref 26.6–33.0)
MCHC: 31.8 g/dL (ref 31.5–35.7)
MCV: 89 fL (ref 79–97)
MONOS ABS: 0.5 10*3/uL (ref 0.1–0.9)
Monocytes: 7 %
NEUTROS PCT: 57 %
Neutrophils Absolute: 3.7 10*3/uL (ref 1.4–7.0)
PLATELETS: 186 10*3/uL (ref 150–379)
RBC: 4.17 x10E6/uL (ref 3.77–5.28)
RDW: 15 % (ref 12.3–15.4)
RH TYPE: POSITIVE
RPR Ser Ql: NONREACTIVE
Rubella Antibodies, IGG: 2.57 index (ref >0.99)
WBC: 6.5 10*3/uL (ref 3.4–10.8)

## 2017-07-06 LAB — HEMOGLOBINOPATHY EVALUATION
FERRITIN: 85 ng/mL (ref 15–150)
HGB A2 QUANT: 2.3 % (ref 1.8–3.2)
HGB A: 96 % — AB (ref 96.4–98.8)
HGB F QUANT: 1.7 % (ref 0.0–2.0)
HGB SOLUBILITY: NEGATIVE
Hgb C: 0 %
Hgb S: 0 %
Hgb Variant: 0 %

## 2017-07-06 LAB — COMPREHENSIVE METABOLIC PANEL
A/G RATIO: 1.4 (ref 1.2–2.2)
ALK PHOS: 74 IU/L (ref 39–117)
ALT: 54 IU/L — AB (ref 0–32)
AST: 58 IU/L — AB (ref 0–40)
Albumin: 4.1 g/dL (ref 3.5–5.5)
BUN/Creatinine Ratio: 12 (ref 9–23)
BUN: 5 mg/dL — ABNORMAL LOW (ref 6–20)
Bilirubin Total: <0.2 mg/dL (ref 0.0–1.2)
CALCIUM: 8.8 mg/dL (ref 8.7–10.2)
CHLORIDE: 100 mmol/L (ref 96–106)
CO2: 16 mmol/L — ABNORMAL LOW (ref 20–29)
Creatinine, Ser: 0.41 mg/dL — ABNORMAL LOW (ref 0.57–1.00)
GFR calc Af Amer: 153 mL/min/{1.73_m2} (ref >59)
GFR, EST NON AFRICAN AMERICAN: 132 mL/min/{1.73_m2} (ref >59)
Globulin, Total: 2.9 g/dL (ref 1.5–4.5)
Glucose: 129 mg/dL — ABNORMAL HIGH (ref 65–99)
POTASSIUM: 3.8 mmol/L (ref 3.5–5.2)
Sodium: 137 mmol/L (ref 134–144)
Total Protein: 7 g/dL (ref 6.0–8.5)

## 2017-07-06 LAB — PROTEIN / CREATININE RATIO, URINE
CREATININE, UR: 86.7 mg/dL
PROTEIN UR: 8.7 mg/dL
PROTEIN/CREAT RATIO: 100 mg/g{creat} (ref 0–200)

## 2017-07-06 LAB — TSH: TSH: 1.31 u[IU]/mL (ref 0.450–4.500)

## 2017-07-09 LAB — SPECIMEN STATUS REPORT

## 2017-07-10 ENCOUNTER — Other Ambulatory Visit: Payer: Self-pay | Admitting: General Practice

## 2017-07-10 DIAGNOSIS — O09522 Supervision of elderly multigravida, second trimester: Secondary | ICD-10-CM

## 2017-07-10 DIAGNOSIS — O24912 Unspecified diabetes mellitus in pregnancy, second trimester: Secondary | ICD-10-CM

## 2017-07-13 ENCOUNTER — Encounter: Payer: Self-pay | Admitting: Family Medicine

## 2017-07-19 ENCOUNTER — Encounter: Payer: Self-pay | Admitting: Family Medicine

## 2017-07-19 ENCOUNTER — Ambulatory Visit (INDEPENDENT_AMBULATORY_CARE_PROVIDER_SITE_OTHER): Payer: Self-pay | Admitting: Family Medicine

## 2017-07-19 VITALS — BP 112/85 | HR 72 | Wt 196.0 lb

## 2017-07-19 DIAGNOSIS — O24912 Unspecified diabetes mellitus in pregnancy, second trimester: Secondary | ICD-10-CM

## 2017-07-19 DIAGNOSIS — O0990 Supervision of high risk pregnancy, unspecified, unspecified trimester: Secondary | ICD-10-CM

## 2017-07-19 DIAGNOSIS — O0992 Supervision of high risk pregnancy, unspecified, second trimester: Secondary | ICD-10-CM

## 2017-07-19 DIAGNOSIS — O09522 Supervision of elderly multigravida, second trimester: Secondary | ICD-10-CM

## 2017-07-19 MED ORDER — GLYBURIDE 5 MG PO TABS: 5.0000 mg | ORAL_TABLET | Freq: Two times a day (BID) | ORAL | 2 refills | Status: DC

## 2017-07-19 NOTE — Patient Instructions (Signed)
Tercer trimestre de Media planner (Third Trimester of Pregnancy) El tercer trimestre comprende desde la [id number] hasta la [id number], es decir, desde el mes7 hasta el mes9. El tercer trimestre es un perodo en el que el feto crece rpidamente. Hacia el final del noveno mes, el feto mide alrededor de 20pulgadas (45cm) de largo y pesa entre 6 y 59 libras (2,700 y 24,500kg). CAMBIOS EN EL ORGANISMO Su organismo atraviesa por muchos cambios durante el [place], y estos varan de Ardelia Mems mujer a Theatre manager.  Seguir [employer] Insurance. Es de esperar que aumente entre 25 y 35libras (37 y 16kg) hacia el final del Media planner.  Podrn aparecer las primeras Apache Corporation caderas, el abdomen y las [place].  Puede tener necesidad de Garment/textile technologist con ms frecuencia porque el feto baja hacia la pelvis y ejerce presin sobre la vejiga.  Debido al Glennis Brink podr sentir Victorio Palm estomacal con frecuencia.  Puede estar estreida, ya que ciertas hormonas enlentecen los movimientos de los msculos que JPM[employer] & Co desechos a travs de los intestinos.  Pueden aparecer hemorroides o abultarse e hincharse las venas (venas varicosas).  Puede sentir dolor plvico debido al Medtronic y a que las hormonas del Scientist, research (life sciences) las articulaciones entre los huesos de la pelvis. El dolor de espalda puede ser consecuencia de la sobrecarga de los msculos que soportan la [place].  Tal vez haya cambios en el cabello que pueden incluir su engrosamiento, crecimiento rpido y cambios en la textura. Adems, a algunas mujeres se les cae el cabello durante o despus del embarazo, o tienen el cabello seco o fino. Lo ms probable es que el cabello se le normalice despus del nacimiento del beb.  Las Lincoln [employer] Corporation seguirn creciendo y Teaching laboratory technician. A veces, puede haber una secrecin amarilla de las mamas llamada calostro.  El ombligo puede salir hacia afuera.  Puede sentir que le falta el aire debido a que se expande el tero.  Puede notar que el feto  "baja" o lo siente ms bajo, en el abdomen.  Puede tener una prdida de secrecin mucosa con sangre. Esto suele ocurrir en el trmino de unos pocos das a una semana antes de que comience el [place] de [place].  El cuello del tero se vuelve delgado y blando (se borra) cerca de la fecha de [place]. QU DEBE ESPERAR EN LOS EXMENES PRENATALES Le harn exmenes prenatales cada 2semanas hasta la semana36. A partir de ese momento le harn exmenes semanales. Durante una visita prenatal de rutina:  La pesarn para asegurarse de que usted y el feto estn creciendo normalmente.  Le tomarn la presin arterial.  Le medirn el abdomen para controlar el desarrollo del beb.  Se escucharn los latidos cardacos fetales.  Se evaluarn los resultados de los estudios solicitados en visitas anteriores.  Le revisarn el cuello del tero cuando est prxima la fecha de parto para controlar si este se ha borrado. Alrededor de la semana36, el mdico le revisar el cuello del tero. Al mismo tiempo, realizar un anlisis de las secreciones del tejido vaginal. Este examen es para determinar si hay un tipo de bacteria, estreptococo Grupo B. El mdico le explicar esto con ms detalle. El mdico puede preguntarle lo siguiente:  Cmo le gustara que fuera el [place].  Cmo se siente.  Si siente los movimientos del beb.  Si ha tenido sntomas anormales, como prdida de lquido, [place], dolores de cabeza intensos o clicos abdominales.  Si est consumiendo algn producto que contenga tabaco, como cigarrillos, tabaco de Higher education careers adviser y  cigarrillos electrnicos.  Si tiene alguna pregunta. Otros exmenes o estudios de deteccin que pueden realizarse durante el tercer trimestre incluyen lo siguiente:  Anlisis de sangre para controlar los niveles de hierro (anemia).  Controles fetales para determinar su salud, nivel de actividad y crecimiento. Si tiene alguna enfermedad o hay problemas durante el embarazo, le harn  estudios.  Prueba del VIH (virus de inmunodeficiencia humana). Si corre un riesgo alto, pueden realizarle una prueba de deteccin del VIH durante el tercer trimestre del embarazo. FALSO TRABAJO DE PARTO Es posible que sienta contracciones leves e irregulares que finalmente desaparecen. Se llaman contracciones de Braxton Hicks o falso trabajo de parto. Las contracciones pueden durar horas, das o incluso semanas, antes de que el verdadero trabajo de parto se inicie. Si las contracciones ocurren a intervalos regulares, se intensifican o se hacen dolorosas, lo mejor es que la revise el mdico. SIGNOS DE TRABAJO DE PARTO  Clicos de tipo menstrual.  Contracciones cada 5minutos o menos.  Contracciones que comienzan en la parte superior del tero y se extienden hacia abajo, a la zona inferior del abdomen y la espalda.  Sensacin de mayor presin en la pelvis o dolor de espalda.  Una secrecin de mucosidad acuosa o con sangre que sale de la vagina. Si tiene alguno de estos signos antes de la semana37 del embarazo, llame a su mdico de inmediato. Debe concurrir al hospital para que la controlen inmediatamente. INSTRUCCIONES PARA EL CUIDADO EN EL HOGAR  Evite fumar, consumir hierbas, beber alcohol y tomar frmacos que no le hayan recetado. Estas sustancias qumicas afectan la formacin y el desarrollo del beb.  No consuma ningn producto que contenga tabaco, lo que incluye cigarrillos, tabaco de mascar y cigarrillos electrnicos. Si necesita ayuda para dejar de fumar, consulte al mdico. Puede recibir asesoramiento y otro tipo de recursos para dejar de fumar.  Siga las indicaciones del mdico en relacin con el uso de medicamentos. Durante el embarazo, hay medicamentos que son seguros de tomar y otros que no.  Haga ejercicio solamente como se lo haya indicado el mdico. Sentir clicos uterinos es un buen signo para detener la actividad fsica.  Contine comiendo alimentos sanos con  regularidad.  Use un sostn que le brinde buen soporte si le duelen las mamas.  No se d baos de inmersin en agua caliente, baos turcos ni saunas.  Use el cinturn de seguridad en todo momento mientras conduce.  No coma carne cruda ni queso sin cocinar; evite el contacto con las bandejas sanitarias de los gatos y la tierra que estos animales usan. Estos elementos contienen grmenes que pueden causar defectos congnitos en el beb.  Tome las vitaminas prenatales.  Tome entre 1500 y 2000mg de calcio diariamente comenzando en la semana20 del embarazo hasta el parto.  Si est estreida, pruebe un laxante suave (si el mdico lo autoriza). Consuma ms alimentos ricos en fibra, como vegetales y frutas frescos y cereales integrales. Beba gran cantidad de lquido para mantener la orina de tono claro o color amarillo plido.  Dese baos de asiento con agua tibia para aliviar el dolor o las molestias causadas por las hemorroides. Use una crema para las hemorroides si el mdico la autoriza.  Si tiene venas varicosas, use medias de descanso. Eleve los pies durante 15minutos, 3 o 4veces por da. Limite el consumo de sal en su dieta.  Evite levantar objetos pesados, use zapatos de tacones bajos y mantenga una buena postura.  Descanse con las piernas elevadas si tiene   calambres o dolor de cintura.  Visite a su dentista si no lo ha [place] manager. Use un cepillo de dientes blando para higienizarse los dientes y psese el hilo dental con suavidad.  Puede seguir [employer], a menos que el mdico le indique lo contrario.  No haga viajes largos excepto que sea absolutamente necesario y solo con la autorizacin del [place] clases prenatales para Development worker, international aid, Psychologist, prison and probation services y hacer preguntas sobre el [place] de parto y [place].  Haga un ensayo de la partida al hospital.  Prepare el bolso que llevar al hospital.  Prepare la habitacin del beb.  Concurra a todas  las visitas prenatales segn las indicaciones de su mdico.  SOLICITE ATENCIN MDICA SI:  No est segura de que est en trabajo de parto o de que ha roto la bolsa de las aguas.  Tiene mareos.  Siente clicos leves, presin en la pelvis o dolor persistente en el abdomen.  Tiene nuseas, vmitos o diarrea persistentes.  Margette Fast secrecin vaginal con mal olor.  Siente dolor al [employer] Airlines.  SOLICITE ATENCIN MDICA DE INMEDIATO SI:  Tiene fiebre.  Tiene una prdida de lquido por la vagina.  Tiene sangrado o pequeas prdidas vaginales.  Siente dolor intenso o clicos en el abdomen.  Sube o baja de peso rpidamente.  Tiene dificultad para respirar y siente dolor de pecho.  Sbitamente se le hinchan mucho el rostro, las [place], los tobillos, los pies o las piernas.  No ha sentido los movimientos del beb durante Leone Brand.  Siente un dolor de cabeza intenso que no se alivia con medicamentos.  Su visin se modifica.  Esta informacin no tiene Marine scientist el consejo del mdico. Asegrese de hacerle al mdico cualquier pregunta que tenga. Document Released: 09/13/2005 Document Revised: 12/25/2014 Document Reviewed: 02/04/2013 Elsevier Interactive Patient Education  2017 [place] (Breastfeeding) Decidir Economist es una de las mejores elecciones que puede hacer por usted y su beb. El cambio hormonal durante el Media planner produce el desarrollo del tejido mamario y [place] la cantidad y el tamao de los conductos galactforos. Estas hormonas tambin permiten que las protenas, los azcares y las grasas de la sangre produzcan la Northeast Utilities materna en las glndulas productoras de [place]. Las hormonas impiden que la leche materna sea liberada antes del nacimiento del beb, adems de impulsar el flujo de leche luego del nacimiento. Una vez que ha comenzado a Economist, Freight forwarder beb, as Therapist, occupational succin o Social research officer, government, pueden estimular la liberacin de [place]  de las glndulas productoras de [place]. LOS BENEFICIOS DE AMAMANTAR Para el beb  La primera leche (calostro) ayuda a Garment/textile technologist funcionamiento del sistema digestivo del beb.  La leche tiene anticuerpos que ayudan a Chemical engineer las infecciones en el beb.  El beb tiene una menor incidencia de asma, alergias y del sndrome de muerte sbita del lactante.  Los nutrientes en la [place] materna son mejores para el beb que la [place] maternizada y estn preparados exclusivamente para cubrir las necesidades del beb.  La leche materna mejora el desarrollo cerebral del beb.  Es menos probable que el beb desarrolle otras enfermedades, como obesidad infantil, asma o diabetes mellitus de tipo 2. Para usted  La lactancia materna favorece el desarrollo de un vnculo muy especial entre la madre y el beb.  Es conveniente. La leche materna siempre est disponible a la Tree surgeon y es [place].  La lactancia materna ayuda a quemar caloras y  a perder el peso ganado [place].  Favorece la contraccin del tero al tamao que tena antes del embarazo de manera ms rpida y disminuye el sangrado (loquios) despus del parto.  La lactancia materna contribuye a reducir Catering manager de desarrollar diabetes mellitus de tipo 2, osteoporosis o cncer de mama o de ovario en el futuro. SIGNOS DE QUE EL BEB EST HAMBRIENTO Primeros signos de hambre  Aumenta su estado de [place].  Se estira.  Mueve la cabeza de un lado a otro.  Mueve la cabeza y abre la boca cuando se le toca la mejilla o la comisura de la boca (reflejo de bsqueda).  [place] vocalizaciones, tales como sonidos de succin, se relame los labios, emite arrullos, suspiros, o chirridos.  Mueve la [employer] boca.  Se chupa con ganas los dedos o las manos. Signos tardos de [employer] Financial.  Llora de manera intermitente. Signos de [employer] Wholes[employer] signos de hambre extrema requerirn que lo calme y  lo consuele antes de que el beb pueda alimentarse adecuadamente. No espere a que se manifiesten los siguientes signos de hambre extrema para comenzar a Economist:  Air cabin crew.  Llanto intenso y fuerte.  Gritos. INFORMACIN BSICA SOBRE LA LACTANCIA MATERNA Iniciacin de la lactancia materna  Encuentre un lugar cmodo para sentarse o acostarse, con un buen respaldo para el cuello y la espalda.  Coloque una almohada o una manta enrollada debajo del beb para acomodarlo a la altura de la mama (si est sentada). Las almohadas para Economist se han diseado especialmente a fin de servir de apoyo para los brazos y el beb Kellogg.  Asegrese de que el abdomen del beb est frente al suyo.  Masajee suavemente la mama. Con las yemas de los dedos, masajee la pared del pecho hacia el pezn en un movimiento circular. Esto estimula el flujo de [place]. Es posible que Oceanographer este movimiento mientras amamanta si la leche fluye lentamente.  Sostenga la mama con el pulgar por arriba del pezn y los otros 4 dedos por debajo de la mama. Asegrese de que los dedos se encuentren lejos del pezn y de la boca del beb.  Empuje suavemente los labios del beb con el pezn o con el dedo.  Cuando la boca del beb se abra lo suficiente, acrquelo rpidamente a la mama e introduzca todo el pezn y la zona oscura que lo rodea (areola), tanto como sea posible, dentro de la boca del beb. ? Debe haber ms areola visible por arriba del labio superior del beb que por debajo del labio inferior. ? La lengua del beb debe estar entre la enca inferior y la [place].  Asegrese de que la boca del beb est en la posicin correcta alrededor del pezn (prendida). Los labios del beb deben crear un sello sobre la mama y estar doblados hacia afuera (invertidos).  Es comn que el beb succione durante 2 a 3 minutos para que comience el flujo de [place]. Cmo debe prenderse Es muy importante que le ensee al  beb cmo prenderse adecuadamente a la mama. Si el beb no se prende adecuadamente, puede causarle dolor en el pezn y reducir la produccin de [place], y hacer que el beb tenga un escaso aumento de [place]. Adems, si el beb no se prende adecuadamente al pezn, puede tragar aire durante la alimentacin. Esto puede causarle molestias al beb. Hacer eructar al beb al Eliezer Lofts de mama puede ayudarlo a liberar el  aire. Sin embargo, ensearle al beb cmo prenderse a la mama adecuadamente es la mejor manera de evitar que se sienta molesto por tragar Administrator, sports se alimenta. Signos de que el beb se ha prendido adecuadamente al pezn:  Hall Busing o succiona de modo silencioso, sin causarle dolor.  Se escucha que traga cada 3 o 4 succiones.  Hay movimientos musculares por arriba y por delante de sus odos al Mining engineer. Signos de que el beb no se ha prendido Product manager al pezn:  Hace ruidos de succin o de chasquido mientras se alimenta.  Siente dolor en el pezn. Si cree que el beb no se prendi correctamente, deslice el dedo en la comisura de la boca y Micron Technology las encas del beb para interrumpir la succin. Intente comenzar a amamantar nuevamente. Signos de Economist Signos del beb:  Disminuye gradualmente el nmero de succiones o cesa la succin por completo.  Se duerme.  Relaja el cuerpo.  Retiene una pequea cantidad de [employer] Corporation boca.  Se desprende solo del pecho. Signos que presenta usted:  Las mamas han aumentado la firmeza, el peso y el tamao 1 a 3 horas despus de Economist.  Estn ms blandas inmediatamente despus de amamantar.  Un aumento del volumen de [place], y tambin un cambio en su consistencia y color se producen hacia el [place] de Therapist, nutritional.  Los pezones no duelen, ni estn agrietados ni sangran. Signos de que su beb recibe la cantidad de leche suficiente  Mojar por lo menos 1 o 2 paales durante las primeras 24  horas despus del nacimiento.  Mojar por lo menos 5 o 6 paales cada 24 horas durante la primera semana despus del nacimiento. La orina debe ser transparente o de color amarillo plido a los 5 das despus del nacimiento.  Mojar entre 6 y 50 paales cada 22 horas a medida que el beb sigue creciendo y desarrollndose.  Defeca al menos 3 veces en 24 horas a los 5 das de vida. La materia fecal debe ser blanda y [place].  Defeca al menos 3 veces en 24 horas a los 7 das de vida. La materia fecal debe ser grumosa y [place].  No registra una prdida de peso mayor del 10% del peso al nacer durante los primeros [place].  Aumenta de peso un promedio de 4 a 7onzas (113 a 198g) por semana despus de los [place].  Aumenta de [place], [place], de [place] uniforme a Proofreader de los 5 das de vida, sin Museum/gallery curator prdida de peso despus de las 2semanas de vida. Despus de alimentarse, es posible que el beb regurgite una pequea cantidad. Esto es frecuente. FRECUENCIA Y DURACIN DE LA LACTANCIA MATERNA El amamantamiento frecuente la ayudar a producir ms [place] y a Warden/ranger de Social research officer, government en los pezones e hinchazn en las Congress. Alimente al beb cuando muestre signos de hambre o si siente la necesidad de reducir la congestin de las [place]. Esto se denomina "lactancia a demanda". Evite el uso del chupete mientras trabaja para establecer la lactancia (las primeras 4 a 6 semanas despus del nacimiento del beb). Despus de este perodo, podr ofrecerle un chupete. Las investigaciones demostraron que el uso del chupete durante el primer ao de vida del beb disminuye el riesgo de desarrollar el sndrome de muerte sbita del lactante (SMSL). Permita que el nio se alimente en cada mama todo lo que desee. Contine amamantando al beb hasta que haya terminado de alimentarse. Cuando  el beb se desprende o se queda dormido mientras se est alimentando de la primera mama, ofrzcale la segunda.  Debido a que, con frecuencia, los recin [place] [employer] las primeras semanas de vida, es posible que deba despertar al beb para alimentarlo. Los horarios de Writer de un beb a otro. Sin embargo, las siguientes reglas pueden servir como gua para ayudarla a Engineer, materials que el beb se alimenta adecuadamente:  Se puede amamantar a los recin nacidos (bebs de 4 semanas o menos de vida) cada 1 a 3 horas.  No deben transcurrir ms de 3 horas durante el da o 5 horas durante la noche sin que se amamante a los recin nacidos.  Debe amamantar al beb 8 veces como mnimo en un perodo de 24 horas, hasta que comience a introducir slidos en su dieta, a los 6 meses de vida aproximadamente. Hope extraccin y Recruitment consultant de la leche materna le permiten asegurarse de que el beb se alimente exclusivamente de [place], aun en momentos en los que no puede amamantar. Esto tiene especial importancia si debe regresar al [place] en el perodo en que an est amamantando o si no puede estar presente en los momentos en que el beb debe alimentarse. Su asesor en lactancia puede orientarla sobre cunto tiempo es [place]. El sacaleche es un aparato que le permite extraer leche de la mama a un recipiente estril. Luego, la leche materna extrada puede almacenarse en un refrigerador o Pension scheme manager. Algunos sacaleches son Theodore Demark, James Ivanoff otros son elctricos. Consulte a su asesor en lactancia qu tipo ser ms conveniente para usted. Los sacaleches se pueden comprar; sin embargo, algunos hospitales y grupos de apoyo a la lactancia materna alquilan Production assistant, radio. Un asesor en lactancia puede ensearle cmo extraer [employer]Max [employer]orporated, en caso de que prefiera no usar un sacaleche. CMO CUIDAR LAS MAMAS DURANTE LA LACTANCIA MATERNA Los pezones se secan, agrietan y duelen durante la Therapist, nutritional. Las siguientes  recomendaciones pueden ayudarla a Theatre manager las [employer] y sanas:  Art therapist usar jabn en los pezones.  Use un sostn de soporte. Aunque no son esenciales, las camisetas sin mangas o los sostenes especiales para Economist estn diseados para acceder fcilmente a las mamas, para Economist sin tener que quitarse todo el sostn o la camiseta. Evite usar sostenes con aro o sostenes muy ajustados.  Seque al aire sus pezones durante 3 a 79minutos despus de amamantar al beb.  Utilice solo apsitos de Chiropodist sostn para Tax adviser las prdidas de [place]. La prdida de un poco de Owens [employer]ning tomas es normal.  Utilice lanolina sobre los pezones luego de Economist. La lanolina ayuda a mantener la humedad normal de la piel. Si [place] lanolina pura, no tiene que lavarse los pezones antes de volver a Research scientist (life sciences) al beb. La lanolina pura no es txica para el beb. Adems, puede extraer Cisco algunas gotas de [place] materna y Community education officer suavemente esa [employer] Resources, para que la [place] se seque al aire. Durante las primeras semanas despus de dar a luz, algunas mujeres pueden experimentar hinchazn en las mamas (congestin [place]). La congestin puede hacer que sienta las mamas pesadas, calientes y sensibles al tacto. El pico de la congestin ocurre dentro de los 3 a 5 das despus del [place]. Las siguientes recomendaciones pueden ayudarla a Public house manager la congestin:  Vace por completo las mamas al [place]. Puede aplicar calor hmedo en las mamas (  en la ducha o con toallas hmedas para manos) antes de Economist o extraer Northeast Utilities. Esto aumenta la circulacin y [place] a que la [place]. Si el beb no vaca por completo las mamas cuando lo amamanta, extraiga la [place] restante despus de que haya finalizado.  Use un sostn ajustado (para amamantar o comn) o una camiseta sin mangas durante 1 o 2 das para indicar al cuerpo que disminuya ligeramente la produccin de  [place].  Aplique compresas de hielo E[employer], a menos que le resulte demasiado incmodo.  Asegrese de que el beb est prendido y se encuentre en la posicin correcta mientras lo alimenta. Si la congestin persiste luego de 48 horas o despus de seguir estas recomendaciones, comunquese con su mdico o un Lobbyist. RECOMENDACIONES GENERALES PARA EL CUIDADO DE LA SALUD DURANTE LA LACTANCIA MATERNA  Consuma alimentos saludables. [place]ne comidas y colaciones, y coma 3 de cada una por da. Dado que lo que come Solectron Corporation, es posible que algunas comidas hagan que su beb se vuelva ms irritable de lo habitual. Evite comer este tipo de alimentos si percibe que afectan de manera negativa al beb.  Beba leche, jugos de fruta y agua para Engineer, water su sed (aproximadamente [place]).  Descanse con frecuencia, reljese y tome sus vitaminas prenatales para evitar la fatiga, el estrs y la anemia.  Contine con los autocontroles de la mama.  Evite Engineer, manufacturing systems y fumar tabaco. Las sustancias qumicas de los cigarrillos que pasan a la leche materna y la exposicin al humo ambiental del tabaco pueden daar al beb.  No consuma alcohol ni drogas, incluida la marihuana. Algunos medicamentos, que pueden ser perjudiciales para el beb, pueden pasar a travs de la SLM Corporation. Es importante que consulte a su mdico antes de Medical sales representative, incluidos todos los medicamentos recetados y de [place], as como los suplementos vitamnicos y herbales. Puede quedar embarazada durante la lactancia. Si desea controlar la natalidad, consulte a su mdico cules son las opciones ms seguras para el beb. SOLICITE ATENCIN MDICA SI:  Usted siente que quiere dejar de Economist o se siente frustrada con la lactancia.  Siente dolor en las mamas o en los pezones.  Sus pezones estn agrietados o Control and instrumentation engineer.  Sus pechos estn irritados, sensibles o calientes.  Tiene un rea  hinchada en cualquiera de las mamas.  Siente escalofros o fiebre.  Tiene nuseas o vmitos.  Presenta una secrecin de otro lquido distinto de la leche materna de los pezones.  Sus mamas no se llenan antes de Economist al beb para el quinto da despus del [place].  Se siente triste y deprimida.  El beb est demasiado somnoliento como para comer bien.  El beb tiene problemas para dormir.  Moja menos de 3 paales en 24 horas.  Defeca menos de 3 veces en 24 horas.  La piel del beb o la parte blanca de los ojos se vuelven amarillentas.  El beb no ha aumentado de [place] a los [place].  SOLICITE ATENCIN MDICA DE INMEDIATO SI:  El beb est muy cansado Engineer, manufacturing) y no se quiere despertar para comer.  Le sube la fiebre sin causa.  Esta informacin no tiene Marine scientist el consejo del mdico. Asegrese de hacerle al mdico cualquier pregunta que tenga. Document Released: 12/04/2005 Document Revised: 03/27/2016 Document Reviewed: 05/28/2013 Elsevier Interactive Patient Education  2017 [employer].

## 2017-07-19 NOTE — Progress Notes (Signed)
    PRENATAL VISIT NOTE  Subjective:  Julie [name] is a 36 y.o. [phone] at [redacted]w[redacted]d being seen today for ongoing prenatal care.  She is currently monitored for the following issues for this high-risk pregnancy and has Obesity, Class II, BMI 35-39.9; Lipoma of lower extremity; Hyperlipidemia LDL goal < 160; Depression; Osteoarthritis; Elevated transaminase level; Knee meniscus pain; Diabetes mellitus type 2, uncomplicated ([place]); Periumbilical hernia; Supervision of high risk pregnancy, antepartum; Diabetes mellitus affecting pregnancy; AMA (advanced maternal age) multigravida 35+; and Previous cesarean section complicating pregnancy, antepartum condition or complication on her problem list.  Patient reports no complaints.   .  .  Movement: Absent. Denies leaking of fluid.   The following portions of the patient's history were reviewed and updated as appropriate: allergies, current medications, past family history, past medical history, past social history, past surgical history and problem list. Problem list updated.  Objective:   Vitals:   07/19/17 1511  BP: 112/85  Pulse: 72  Weight: 196 lb (88.9 kg)    Fetal Status: Fetal Heart Rate (bpm): 163   Movement: Absent     General:  Alert, oriented and cooperative. Patient is in no acute distress.  Skin: Skin is warm and dry. No rash noted.   Cardiovascular: Normal heart rate noted  Respiratory: Normal respiratory effort, no problems with respiration noted  Abdomen: Soft, gravid, appropriate for gestational age.        Pelvic: Cervical exam deferred        Extremities: Normal range of motion.  Edema: None  Mental Status:  Normal mood and affect. Normal behavior. Normal judgment and thought content.  FBS 130-160 2 hour pp 180-290 All BS are out of range. Assessment and Plan:  Pregnancy: [id number] at [redacted]w[redacted]d  1. Diabetes mellitus affecting pregnancy in second trimester Will likely need insulin but she refused it today--would like to  try second oral treatment option added today. Risks of diabetes in pregnancy and in life reviewed at length. - Hemoglobin A1c - glyBURIDE (DIABETA) 5 MG tablet; Take 1 tablet (5 mg total) by mouth 2 (two) times daily with a meal.  Dispense: 60 tablet; Refill: 2  2. Supervision of high risk pregnancy, antepartum Continue prenatal care.   3. Elderly multigravida in second trimester Desires screening--NIPS sent today - MaterniT21 PLUS Core  General obstetric precautions including but not limited to vaginal bleeding, contractions, leaking of fluid and fetal movement were reviewed in detail with the patient. Please refer to After Visit Summary for other counseling recommendations.  Return in 4 weeks (on 08/16/2017).   Donnamae Jude, MD

## 2017-07-24 LAB — MATERNIT 21 PLUS CORE, BLOOD
Chromosome 13: NEGATIVE
Chromosome 18: NEGATIVE
Chromosome 21: NEGATIVE
Y Chromosome: DETECTED

## 2017-07-24 LAB — HEMOGLOBIN A1C
ESTIMATED AVERAGE GLUCOSE: 183 mg/dL
HEMOGLOBIN A1C: 8 % — AB (ref 4.8–5.6)

## 2017-08-16 ENCOUNTER — Ambulatory Visit (INDEPENDENT_AMBULATORY_CARE_PROVIDER_SITE_OTHER): Payer: Self-pay | Admitting: Obstetrics & Gynecology

## 2017-08-16 VITALS — BP 115/77 | HR 93 | Wt 199.5 lb

## 2017-08-16 DIAGNOSIS — O0992 Supervision of high risk pregnancy, unspecified, second trimester: Secondary | ICD-10-CM

## 2017-08-16 DIAGNOSIS — O24912 Unspecified diabetes mellitus in pregnancy, second trimester: Secondary | ICD-10-CM

## 2017-08-16 DIAGNOSIS — O0990 Supervision of high risk pregnancy, unspecified, unspecified trimester: Secondary | ICD-10-CM

## 2017-08-16 NOTE — Progress Notes (Signed)
   PRENATAL VISIT NOTE  Subjective:  Julie [name] is a 36 y.o. [phone] at [redacted]w[redacted]d being seen today for ongoing prenatal care.  She is currently monitored for the following issues for this high-risk pregnancy and has Obesity, Class II, BMI 35-39.9; Lipoma of lower extremity; Hyperlipidemia LDL goal < 160; Depression; Osteoarthritis; Elevated transaminase level; Knee meniscus pain; Diabetes mellitus type 2, uncomplicated ([place]); Periumbilical hernia; Supervision of high risk pregnancy, antepartum; Diabetes mellitus affecting pregnancy; AMA (advanced maternal age) multigravida 35+; and Previous cesarean section complicating pregnancy, antepartum condition or complication on her problem list.  Patient reports no complaints.  Contractions: Not present. Vag. Bleeding: None.  Movement: Present. Denies leaking of fluid.   The following portions of the patient's history were reviewed and updated as appropriate: allergies, current medications, past family history, past medical history, past social history, past surgical history and problem list. Problem list updated.  Objective:   Vitals:   08/16/17 1306  BP: 115/77  Pulse: 93  Weight: 90.5 kg (199 lb 8 oz)    Fetal Status: Fetal Heart Rate (bpm): 164   Movement: Present     General:  Alert, oriented and cooperative. Patient is in no acute distress.  Skin: Skin is warm and dry. No rash noted.   Cardiovascular: Normal heart rate noted  Respiratory: Normal respiratory effort, no problems with respiration noted  Abdomen: Soft, gravid, appropriate for gestational age.  Pain/Pressure: Present     Pelvic: Cervical exam deferred        Extremities: Normal range of motion.  Edema: None  Mental Status:  Normal mood and affect. Normal behavior. Normal judgment and thought content.   Assessment and Plan:  Pregnancy: [id number] at [redacted]w[redacted]d  1. Diabetes mellitus affecting pregnancy in second trimester Type 2 DM, Hb A1C was 8.0 - US Fetal  Echocardiography; Future  2. Supervision of high risk pregnancy, antepartum  - US Fetal Echocardiography; Future  Preterm labor symptoms and general obstetric precautions including but not limited to vaginal bleeding, contractions, leaking of fluid and fetal movement were reviewed in detail with the patient. Please refer to After Visit Summary for other counseling recommendations.  Return in about 2 weeks (around 08/30/2017).  Bring BG log next visit, may need to adjust to cover FBS >100 Emeterio Reeve, MD

## 2017-08-16 NOTE — Patient Instructions (Signed)
Segundo trimestre de Media planner (Second Trimester of Pregnancy) El segundo trimestre va desde la semana13 hasta la 61, desde el cuarto hasta el sexto mes, y suele ser el momento en el que mejor se siente. Su organismo se ha adaptado a Public relations account executive y comienza a Print production planner. En general, las nuseas matutinas han disminuido o han desaparecido completamente, puede tener ms energa y un aumento de apetito. El segundo trimestre es tambin la poca en la que el feto se desarrolla rpidamente. Hacia el final del sexto mes, el feto mide aproximadamente 9pulgadas (23cm) y pesa alrededor de 1 libras (700g). Es probable que sienta que el beb se Software engineer (da pataditas) entre las 78 y 20semanas del Media planner. CAMBIOS EN EL ORGANISMO Su organismo atraviesa por muchos cambios durante el [place], y estos varan de Ardelia Mems mujer a Theatre manager.  Seguir [employer]. Notar que la parte baja del abdomen sobresale.  Podrn aparecer las primeras [employer] caderas, el abdomen y las [place].  Es posible que tenga dolores de cabeza que pueden aliviarse con los medicamentos que el mdico le permita tomar.  Tal vez tenga necesidad de orinar con ms frecuencia porque el feto est ejerciendo presin Field seismologist.  Debido al Glennis Brink podr sentir Victorio Palm estomacal con frecuencia.  Puede estar estreida, ya que ciertas hormonas enlentecen los movimientos de los msculos que [employer] desechos a travs de los intestinos.  Pueden aparecer hemorroides o abultarse e hincharse las venas (venas varicosas).  Puede tener dolor de espalda que se debe al [employer] Company de peso y a que las hormonas del Scientist, research (life sciences) las articulaciones entre los huesos de la pelvis, y Civil Service fast streamer consecuencia de la modificacin del peso y los msculos que mantienen el equilibrio.  Las [employer] seguirn creciendo y Teaching laboratory technician.  Las Production manager y estar sensibles al cepillado y al hilo dental.  Pueden aparecer zonas oscuras o  manchas (cloasma, mscara del Media planner) en el rostro que probablemente se atenuar despus del nacimiento del beb.  Es posible que se forme una lnea oscura desde el ombligo hasta la zona del pubis (linea nigra) que probablemente se atenuar despus del nacimiento del beb.  Tal vez haya cambios en el cabello que pueden incluir su engrosamiento, crecimiento rpido y cambios en la textura. Adems, a algunas mujeres se les cae el cabello durante o despus del embarazo, o tienen el cabello seco o fino. Lo ms probable es que el cabello se le normalice despus del nacimiento del beb. QU DEBE ESPERAR EN LAS CONSULTAS PRENATALES Durante una visita prenatal de rutina:  La pesarn para asegurarse de que usted y el feto estn creciendo normalmente.  Le tomarn la presin arterial.  Le medirn el abdomen para controlar el desarrollo del beb.  Se escucharn los latidos cardacos fetales.  Se evaluarn los resultados de los estudios solicitados en visitas anteriores. El mdico puede preguntarle lo siguiente:  Cmo se siente.  Si siente los movimientos del beb.  Si ha tenido sntomas anormales, como prdida de lquido, Naomi, dolores de cabeza intensos o clicos abdominales.  Si est consumiendo algn producto que contenga tabaco, como cigarrillos, tabaco de Higher education careers adviser y Psychologist, sport and exercise.  Si tiene [employer]. Otros estudios que podrn realizarse durante el segundo trimestre incluyen lo siguiente:  Anlisis de sangre para detectar lo siguiente: ? Concentraciones de hierro bajas (anemia). ? Diabetes gestacional (entre la semana 24 y la 43). ? Anticuerpos Rh.  Anlisis de orina para detectar infecciones, diabetes o protenas en la orina.  Una ecografa para confirmar que el beb crece y se desarrolla correctamente.  Una amniocentesis para diagnosticar posibles problemas genticos.  Estudios del feto para descartar espina bfida y sndrome de Down.  Prueba del VIH (virus  de inmunodeficiencia humana). Los exmenes prenatales de rutina incluyen la prueba de deteccin del VIH, a menos que decida no Radiation protection practitioner. INSTRUCCIONES PARA EL CUIDADO EN EL HOGAR  Evite fumar, consumir hierbas, beber alcohol y tomar frmacos que no le hayan recetado. Estas sustancias qumicas afectan la formacin y el desarrollo del beb.  No consuma ningn producto que contenga tabaco, lo que incluye cigarrillos, tabaco de Higher education careers adviser y Psychologist, sport and exercise. Si necesita ayuda para dejar de fumar, consulte al [employer]. Puede recibir asesoramiento y otro tipo de recursos para dejar de fumar.  [place] mdico en relacin con el uso de medicamentos. Durante el embarazo, hay medicamentos que son seguros de tomar y otros que no.  Haga ejercicio solamente como se lo haya indicado el mdico. Sentir clicos uterinos es un buen signo para Ambulance person actividad fsica.  Contine comiendo alimentos sanos con regularidad.  Use un sostn que le brinde buen soporte si le [employer].  No se d baos de inmersin en agua caliente, baos turcos ni saunas.  Use el cinturn de seguridad en todo momento mientras conduce.  No coma carne cruda ni queso sin cocinar; evite el contacto con las bandejas sanitarias de los gatos y la tierra que estos animales usan. Estos elementos contienen grmenes que pueden causar defectos congnitos en el beb.  [place].  Tome entre 1500 y 2000mg  de calcio diariamente comenzando en la [id number] del embarazo [place].  Si est estreida, pruebe un laxante suave (si el mdico lo autoriza). Consuma ms alimentos ricos en fibra, como vegetales y frutas frescos y Psychologist, prison and probation services. Beba gran cantidad de lquido para mantener la orina de tono claro o color amarillo plido.  Dese baos de asiento con agua tibia para Best boy o las molestias causadas por las hemorroides. Use una crema para las hemorroides si el mdico la  autoriza.  Si tiene venas varicosas, use medias de descanso. Eleve los pies durante 11minutos, 3 o 4veces por da. Limite el consumo de sal en su dieta.  No levante objetos pesados, use zapatos de tacones bajos y mantenga una buena postura.  Descanse con las piernas elevadas si tiene calambres o dolor de cintura.  Visite a su dentista si an no lo ha Quarry manager. Use un cepillo de dientes blando para higienizarse los dientes y psese el hilo dental con suavidad.  Puede seguir [employer], a menos que el mdico le indique lo contrario.  Concurra a todas las visitas prenatales segn las indicaciones de su mdico.  SOLICITE ATENCIN MDICA SI:  Natale Milch.  Siente clicos leves, presin en la pelvis o dolor persistente en el abdomen.  Tiene nuseas, vmitos o diarrea persistentes.  Margette Fast secrecin vaginal con mal olor.  Siente dolor al [employer].  SOLICITE ATENCIN MDICA DE INMEDIATO SI:  Tiene fiebre.  Tiene una prdida de lquido por la vagina.  Tiene sangrado o pequeas prdidas vaginales.  Siente dolor intenso o clicos en el abdomen.  Sube o baja de peso rpidamente.  Tiene dificultad para respirar y siente dolor de pecho.  Sbitamente se le hinchan mucho el rostro, las [place], los tobillos, los pies o las piernas.  No ha sentido los movimientos del beb durante Leone Brand.  Siente un dolor de cabeza intenso que no se alivia con medicamentos.  Su visin se modifica.  Esta informacin no tiene Marine scientist el consejo del mdico. Asegrese de hacerle al mdico cualquier pregunta que tenga. Document Released: 09/13/2005 Document Revised: 12/25/2014 Document Reviewed: 02/04/2013 Elsevier Interactive Patient Education  2017 [employer].

## 2017-08-16 NOTE — Progress Notes (Signed)
Fetal Echo scheduled with Dr. Filbert Schilder on October 9th @ 0930.  Pt notified and also informed to bring $100 to her appt due to her being self-pay.  Pt stated understanding.

## 2017-08-24 ENCOUNTER — Ambulatory Visit (HOSPITAL_COMMUNITY)
Admission: RE | Admit: 2017-08-24 | Discharge: 2017-08-24 | Disposition: A | Discharge status: Home / Self Care | Payer: Self-pay | Source: Ambulatory Visit | Attending: Obstetrics and Gynecology | Admitting: Obstetrics and Gynecology

## 2017-08-24 ENCOUNTER — Other Ambulatory Visit: Payer: Self-pay | Admitting: Family

## 2017-08-24 ENCOUNTER — Other Ambulatory Visit: Payer: Self-pay | Admitting: Obstetrics and Gynecology

## 2017-08-24 ENCOUNTER — Other Ambulatory Visit (HOSPITAL_COMMUNITY): Payer: Self-pay | Admitting: *Deleted

## 2017-08-24 ENCOUNTER — Encounter (HOSPITAL_COMMUNITY): Payer: Self-pay

## 2017-08-24 DIAGNOSIS — R21 Rash and other nonspecific skin eruption: Secondary | ICD-10-CM

## 2017-08-24 DIAGNOSIS — Z363 Encounter for antenatal screening for malformations: Secondary | ICD-10-CM

## 2017-08-24 DIAGNOSIS — O99212 Obesity complicating pregnancy, second trimester: Secondary | ICD-10-CM

## 2017-08-24 DIAGNOSIS — O09529 Supervision of elderly multigravida, unspecified trimester: Secondary | ICD-10-CM

## 2017-08-24 DIAGNOSIS — O09522 Supervision of elderly multigravida, second trimester: Secondary | ICD-10-CM

## 2017-08-24 DIAGNOSIS — E669 Obesity, unspecified: Secondary | ICD-10-CM | POA: Insufficient documentation

## 2017-08-24 DIAGNOSIS — Z3A18 18 weeks gestation of pregnancy: Secondary | ICD-10-CM

## 2017-08-24 DIAGNOSIS — O34219 Maternal care for unspecified type scar from previous cesarean delivery: Secondary | ICD-10-CM

## 2017-08-24 DIAGNOSIS — O321XX0 Maternal care for breech presentation, not applicable or unspecified: Secondary | ICD-10-CM | POA: Insufficient documentation

## 2017-08-24 DIAGNOSIS — O24912 Unspecified diabetes mellitus in pregnancy, second trimester: Secondary | ICD-10-CM

## 2017-08-24 NOTE — Progress Notes (Signed)
Genetic Counseling  High-Risk Gestation Note  Appointment Date:  08/24/2017 Referred By: Chancy Milroy, MD Date of Birth:  [birth year]   Pregnancy History: [id number] Estimated Date of Delivery: 01/19/18 Estimated Gestational Age: [redacted]w[redacted]d Attending: Abram Sander, MD  [name] and her partner were seen for genetic counseling because of a maternal age of 36 y.o..     In summary:  Discussed increased risk for fetal aneuploidy with advanced maternal age  Reviewed results of patient's NIPS and ultrasound  Discussed limitations of screening and option of diagnostic testing  Declined amniocentesis  No family history concerns reported   They were counseled regarding maternal age and the association with risk for chromosome conditions due to nondisjunction with aging of the ova.   We reviewed chromosomes, nondisjunction, and the associated 1 in 4 risk for fetal aneuploidy related to a maternal age of 36 y.o. at [redacted]w[redacted]d gestation.  They were counseled that the risk for aneuploidy decreases as gestational age increases, accounting for those pregnancies which spontaneously abort.  We specifically discussed Down syndrome (trisomy 66), trisomies 60 and 35, and sex chromosome aneuploidies (47,XXX and 47,XXY) including the common features and prognoses of each.   We also reviewed the results of Mrs. Rangel Loyola non-invasive prenatal screening (NIPS) and ultrasound.  We discussed that NIPS analyzes placental cell free DNA in maternal circulation to evaluate for the presence of extra chromosome conditions.  Thus, it is able to provide risk assessment for specific chromosome conditions, but is not diagnostic.  Specifically, Mrs. Tamra Koos had MaterniT21 through [employer], which was within normal limits regarding trisomy 21, trisomy 62, trisomy 12, and sex chromosome aneuploidy.   They were counseled that 50-80% of fetuses with Down syndrome and up to 90% of fetuses with trisomies 83 and 18, when  well visualized, have detectable anomalies or soft markers by ultrasound.  Her ultrasound was limited due to fetal positioning, but no specific concerns were identified at this time. Follow-up ultrasound was scheduled for 09/21/17.   We also discussed the availability of diagnostic testing by way of amniocentesis.  We reviewed the risks, benefits and limitations of amniocentesis including the approximate 1 in 300-500 risk for pregnancy complications following amniocentesis. We discussed the possible results that the tests might provide including: positive, negative, unanticipated, and no result.    After reviewing the above results and the available options, Mrs. Naeemah Jasmer expressed that she is not interested in pursuing diagnostic testing at this time or in the future, given the associated risk of complications.  They understand that ultrasound and NIPS cannot rule out all birth defects or genetic syndromes.    Ms. Jihan Mellette Loyola was provided with written information regarding cystic fibrosis (CF), spinal muscular atrophy (SMA) and hemoglobinopathies including the carrier frequency, availability of carrier screening and prenatal diagnosis if indicated.  In addition, we discussed that CF and hemoglobinopathies are routinely screened for as part of the [place] newborn screening panel.  She declined screening for CF and SMA. Hemoglobinopathy evaluation was previously performed and within normal limits.  Cursory family history review was noncontributory for birth defects, intellectual disability, or known genetic conditions. Consanguinity was denied. The couple declined detailed family history intake and review at the time of today's visit. Without further information regarding the provided family history, an accurate genetic risk cannot be calculated. Further genetic counseling is warranted if more information is obtained.  Patient declined detailed discussion of pregnancy history. She denied the use  of alcohol, tobacco or  street drugs.   I counseled this couple regarding the above risks and available options.  The approximate face-to-face time with the genetic counselor was 20 minutes.  Chipper Oman, MS,  Certified Genetic Counselor 08/24/2017

## 2017-09-19 ENCOUNTER — Encounter: Payer: Self-pay | Admitting: Family Medicine

## 2017-09-20 ENCOUNTER — Ambulatory Visit (INDEPENDENT_AMBULATORY_CARE_PROVIDER_SITE_OTHER): Payer: Self-pay | Admitting: Obstetrics & Gynecology

## 2017-09-20 ENCOUNTER — Encounter: Payer: Self-pay | Admitting: Obstetrics & Gynecology

## 2017-09-20 VITALS — BP 119/74 | HR 88 | Wt 206.9 lb

## 2017-09-20 DIAGNOSIS — O0992 Supervision of high risk pregnancy, unspecified, second trimester: Secondary | ICD-10-CM

## 2017-09-20 DIAGNOSIS — R21 Rash and other nonspecific skin eruption: Secondary | ICD-10-CM

## 2017-09-20 DIAGNOSIS — O0990 Supervision of high risk pregnancy, unspecified, unspecified trimester: Secondary | ICD-10-CM

## 2017-09-20 DIAGNOSIS — O34219 Maternal care for unspecified type scar from previous cesarean delivery: Secondary | ICD-10-CM

## 2017-09-20 MED ORDER — METFORMIN HCL 500 MG PO TABS: ORAL_TABLET | ORAL | 3 refills | Status: DC

## 2017-09-20 NOTE — Progress Notes (Signed)
   PRENATAL VISIT NOTE  Subjective:  Julie [name] is a 36 y.o. [phone] at [redacted]w[redacted]d being seen today for ongoing prenatal care.  She is currently monitored for the following issues for this high-risk pregnancy and has Obesity, Class II, BMI 35-39.9; Lipoma of lower extremity; Hyperlipidemia LDL goal < 160; Depression; Osteoarthritis; Elevated transaminase level; Knee meniscus pain; Diabetes mellitus type 2, uncomplicated ([place]); Periumbilical hernia; Supervision of high risk pregnancy, antepartum; Diabetes mellitus affecting pregnancy; AMA (advanced maternal age) multigravida 35+; Previous cesarean section complicating pregnancy, antepartum condition or complication; and [redacted] weeks gestation of pregnancy on her problem list.  Patient reports no complaints.  Contractions: Not present. Vag. Bleeding: None.  Movement: Present. Denies leaking of fluid.   The following portions of the patient's history were reviewed and updated as appropriate: allergies, current medications, past family history, past medical history, past social history, past surgical history and problem list. Problem list updated.  Objective:   Vitals:   09/20/17 1612  BP: 119/74  Pulse: 88  Weight: 206 lb 14.4 oz (93.8 kg)    Fetal Status:     Movement: Present     General:  Alert, oriented and cooperative. Patient is in no acute distress.  Skin: Skin is warm and dry. No rash noted.   Cardiovascular: Normal heart rate noted  Respiratory: Normal respiratory effort, no problems with respiration noted  Abdomen: Soft, gravid, appropriate for gestational age.  Pain/Pressure: Absent     Pelvic: Cervical exam deferred        Extremities: Normal range of motion.  Edema: None  Mental Status:  Normal mood and affect. Normal behavior. Normal judgment and thought content.   Assessment and Plan:  Pregnancy: [id number] at [redacted]w[redacted]d  1. Supervision of high risk pregnancy, antepartum Normal [place] with f/u scheduled FBS still often>95 2.  Rash  - metFORMIN (GLUCOPHAGE) 500 MG tablet; TAKE 1 TABLET BY MOUTH TWICE DAILY WITH BREAKFAST AND SUPPER  Dispense: 60 tablet; Refill: 3  Preterm labor symptoms and general obstetric precautions including but not limited to vaginal bleeding, contractions, leaking of fluid and fetal movement were reviewed in detail with the patient. Please refer to After Visit Summary for other counseling recommendations.  Return in about 2 weeks (around 10/04/2017).   Emeterio Reeve, MD

## 2017-09-20 NOTE — Progress Notes (Signed)
tpo

## 2017-09-20 NOTE — Patient Instructions (Signed)

## 2017-09-21 ENCOUNTER — Encounter (HOSPITAL_COMMUNITY): Payer: Self-pay

## 2017-09-21 ENCOUNTER — Other Ambulatory Visit (HOSPITAL_COMMUNITY): Payer: Self-pay | Admitting: Maternal & Fetal Medicine

## 2017-09-21 ENCOUNTER — Ambulatory Visit (HOSPITAL_COMMUNITY)
Admission: RE | Admit: 2017-09-21 | Discharge: 2017-09-21 | Disposition: A | Discharge status: Home / Self Care | Payer: Self-pay | Source: Ambulatory Visit | Attending: Obstetrics and Gynecology | Admitting: Obstetrics and Gynecology

## 2017-09-21 DIAGNOSIS — O24415 Gestational diabetes mellitus in pregnancy, controlled by oral hypoglycemic drugs: Secondary | ICD-10-CM | POA: Insufficient documentation

## 2017-09-21 DIAGNOSIS — O09529 Supervision of elderly multigravida, unspecified trimester: Secondary | ICD-10-CM

## 2017-09-21 DIAGNOSIS — Z362 Encounter for other antenatal screening follow-up: Secondary | ICD-10-CM

## 2017-09-21 DIAGNOSIS — Z98891 History of uterine scar from previous surgery: Secondary | ICD-10-CM

## 2017-09-21 DIAGNOSIS — O34219 Maternal care for unspecified type scar from previous cesarean delivery: Secondary | ICD-10-CM

## 2017-09-21 DIAGNOSIS — O99212 Obesity complicating pregnancy, second trimester: Secondary | ICD-10-CM | POA: Insufficient documentation

## 2017-09-21 DIAGNOSIS — O24012 Pre-existing diabetes mellitus, type 1, in pregnancy, second trimester: Secondary | ICD-10-CM

## 2017-09-21 DIAGNOSIS — O09522 Supervision of elderly multigravida, second trimester: Secondary | ICD-10-CM | POA: Insufficient documentation

## 2017-09-21 DIAGNOSIS — Z3A22 22 weeks gestation of pregnancy: Secondary | ICD-10-CM

## 2017-09-21 DIAGNOSIS — O34211 Maternal care for low transverse scar from previous cesarean delivery: Secondary | ICD-10-CM | POA: Insufficient documentation

## 2017-10-04 ENCOUNTER — Ambulatory Visit (INDEPENDENT_AMBULATORY_CARE_PROVIDER_SITE_OTHER): Payer: Self-pay | Admitting: Obstetrics & Gynecology

## 2017-10-04 VITALS — BP 115/68 | HR 97 | Wt 210.0 lb

## 2017-10-04 DIAGNOSIS — O24912 Unspecified diabetes mellitus in pregnancy, second trimester: Secondary | ICD-10-CM

## 2017-10-04 DIAGNOSIS — O0990 Supervision of high risk pregnancy, unspecified, unspecified trimester: Secondary | ICD-10-CM

## 2017-10-04 DIAGNOSIS — O0992 Supervision of high risk pregnancy, unspecified, second trimester: Secondary | ICD-10-CM

## 2017-10-04 LAB — POCT URINALYSIS DIP (DEVICE)
Glucose, UA: 100 mg/dL — AB
HGB URINE DIPSTICK: NEGATIVE
LEUKOCYTES UA: NEGATIVE
NITRITE: NEGATIVE
Protein, ur: NEGATIVE mg/dL
Urobilinogen, UA: 0.2 mg/dL (ref 0.0–1.0)
pH: 5.5 (ref 5.0–8.0)

## 2017-10-04 NOTE — Patient Instructions (Signed)

## 2017-10-04 NOTE — Progress Notes (Signed)
   PRENATAL VISIT NOTE  Subjective:  Julie [name] is a 36 y.o. [phone] at [redacted]w[redacted]d being seen today for ongoing prenatal care.  She is currently monitored for the following issues for this high-risk pregnancy and has Obesity, Class II, BMI 35-39.9; Lipoma of lower extremity; Hyperlipidemia LDL goal < 160; Depression; Osteoarthritis; Elevated transaminase level; Knee meniscus pain; Diabetes mellitus type 2, uncomplicated ([place]); Periumbilical hernia; Supervision of high risk pregnancy, antepartum; Diabetes mellitus affecting pregnancy; AMA (advanced maternal age) multigravida 35+; Previous cesarean section complicating pregnancy, antepartum condition or complication; and [redacted] weeks gestation of pregnancy on her problem list.  Patient reports no complaints.  Contractions: Not present. Vag. Bleeding: None.  Movement: Present. Denies leaking of fluid.   The following portions of the patient's history were reviewed and updated as appropriate: allergies, current medications, past family history, past medical history, past social history, past surgical history and problem list. Problem list updated.  Objective:   Vitals:   10/04/17 1544  BP: 115/68  Pulse: 97    Fetal Status: Fetal Heart Rate (bpm): 161   Movement: Present     General:  Alert, oriented and cooperative. Patient is in no acute distress.  Skin: Skin is warm and dry. No rash noted.   Cardiovascular: Normal heart rate noted  Respiratory: Normal respiratory effort, no problems with respiration noted  Abdomen: Soft, gravid, appropriate for gestational age.  Pain/Pressure: Absent     Pelvic: Cervical exam deferred        Extremities: Normal range of motion.  Edema: None  Mental Status:  Normal mood and affect. Normal behavior. Normal judgment and thought content.   Assessment and Plan:  Pregnancy: [id number] at [redacted]w[redacted]d  1. Diabetes mellitus affecting pregnancy in second trimester States that FBS are better but did not bring her  record  2. Supervision of high risk pregnancy, antepartum   Preterm labor symptoms and general obstetric precautions including but not limited to vaginal bleeding, contractions, leaking of fluid and fetal movement were reviewed in detail with the patient. Please refer to After Visit Summary for other counseling recommendations.  Return in about 2 weeks (around 10/18/2017). Bring BS log   Emeterio Reeve, MD

## 2017-10-18 ENCOUNTER — Encounter: Payer: Self-pay | Admitting: Obstetrics and Gynecology

## 2017-10-19 ENCOUNTER — Ambulatory Visit (HOSPITAL_COMMUNITY)
Admission: RE | Admit: 2017-10-19 | Discharge: 2017-10-19 | Disposition: A | Discharge status: Home / Self Care | Payer: Self-pay | Source: Ambulatory Visit | Attending: Obstetrics and Gynecology | Admitting: Obstetrics and Gynecology

## 2017-10-19 ENCOUNTER — Encounter (HOSPITAL_COMMUNITY): Payer: Self-pay

## 2017-10-19 ENCOUNTER — Other Ambulatory Visit (HOSPITAL_COMMUNITY): Payer: Self-pay | Admitting: Maternal & Fetal Medicine

## 2017-10-19 DIAGNOSIS — O321XX0 Maternal care for breech presentation, not applicable or unspecified: Secondary | ICD-10-CM | POA: Insufficient documentation

## 2017-10-19 DIAGNOSIS — O34219 Maternal care for unspecified type scar from previous cesarean delivery: Secondary | ICD-10-CM

## 2017-10-19 DIAGNOSIS — O09529 Supervision of elderly multigravida, unspecified trimester: Secondary | ICD-10-CM

## 2017-10-19 DIAGNOSIS — Z3A26 26 weeks gestation of pregnancy: Secondary | ICD-10-CM

## 2017-10-19 DIAGNOSIS — E669 Obesity, unspecified: Secondary | ICD-10-CM | POA: Insufficient documentation

## 2017-10-19 DIAGNOSIS — O09522 Supervision of elderly multigravida, second trimester: Secondary | ICD-10-CM | POA: Insufficient documentation

## 2017-10-19 DIAGNOSIS — Z362 Encounter for other antenatal screening follow-up: Secondary | ICD-10-CM

## 2017-10-19 DIAGNOSIS — O24415 Gestational diabetes mellitus in pregnancy, controlled by oral hypoglycemic drugs: Secondary | ICD-10-CM

## 2017-10-19 DIAGNOSIS — O99212 Obesity complicating pregnancy, second trimester: Secondary | ICD-10-CM

## 2017-10-24 ENCOUNTER — Encounter: Payer: Self-pay | Admitting: Obstetrics and Gynecology

## 2017-11-01 ENCOUNTER — Encounter: Payer: Self-pay | Admitting: Family Medicine

## 2017-11-01 NOTE — Progress Notes (Signed)
Patient did not keep appointment today. She will be called to reschedule.  

## 2017-11-07 ENCOUNTER — Ambulatory Visit (INDEPENDENT_AMBULATORY_CARE_PROVIDER_SITE_OTHER): Payer: Self-pay | Admitting: Obstetrics & Gynecology

## 2017-11-07 VITALS — BP 124/70 | HR 93 | Wt 215.0 lb

## 2017-11-07 DIAGNOSIS — O24913 Unspecified diabetes mellitus in pregnancy, third trimester: Secondary | ICD-10-CM

## 2017-11-07 DIAGNOSIS — O34219 Maternal care for unspecified type scar from previous cesarean delivery: Secondary | ICD-10-CM

## 2017-11-07 DIAGNOSIS — O0990 Supervision of high risk pregnancy, unspecified, unspecified trimester: Secondary | ICD-10-CM

## 2017-11-07 DIAGNOSIS — O0993 Supervision of high risk pregnancy, unspecified, third trimester: Secondary | ICD-10-CM

## 2017-11-07 DIAGNOSIS — K429 Umbilical hernia without obstruction or gangrene: Secondary | ICD-10-CM

## 2017-11-07 MED ORDER — TETANUS-DIPHTH-ACELL PERTUSSIS 5-2.5-18.5 LF-MCG/0.5 IM SUSP
0.5000 mL | Freq: Once | INTRAMUSCULAR | Status: AC
  Administered 2017-11-07: 0.5 mL via INTRAMUSCULAR

## 2017-11-07 NOTE — Progress Notes (Signed)
Pt stated belly button is painful/purple especially for about 2 weeks. Also the vagina is dry.

## 2017-11-07 NOTE — Progress Notes (Signed)
   PRENATAL VISIT NOTE  Subjective:  Julie [name] is a 36 y.o. [phone] at [redacted]w[redacted]d being seen today for ongoing prenatal care.  She is currently monitored for the following issues for this high-risk pregnancy and has Obesity, Class II, BMI 35-39.9; Lipoma of lower extremity; Hyperlipidemia LDL goal < 160; Depression; Osteoarthritis; Elevated transaminase level; Knee meniscus pain; Diabetes mellitus type 2, uncomplicated ([place]); Periumbilical hernia; Supervision of high risk pregnancy, antepartum; Diabetes mellitus affecting pregnancy; AMA (advanced maternal age) multigravida 35+; and Previous cesarean section complicating pregnancy, antepartum condition or complication on their problem list.  Patient reports vaginal dryness, and occasionally painful umbilical hernia.   Contractions: Not present. Vag. Bleeding: None.  Movement: Present. Denies leaking of fluid.   The following portions of the patient's history were reviewed and updated as appropriate: allergies, current medications, past family history, past medical history, past social history, past surgical history and problem list. Problem list updated.  Objective:   Vitals:   11/07/17 1600  BP: 124/70  Pulse: 93  Weight: 215 lb (97.5 kg)    Fetal Status: Fetal Heart Rate (bpm): 158 Fundal Height: 29 cm Movement: Present     General:  Alert, oriented and cooperative. Patient is in no acute distress.  Skin: Skin is warm and dry. No rash noted.   Cardiovascular: Normal heart rate noted  Respiratory: Normal respiratory effort, no problems with respiration noted  Abdomen: Soft, gravid, appropriate for gestational age.  Pain/Pressure: Present     Pelvic: Cervical exam deferred        Extremities: Normal range of motion.  Edema: None  Mental Status:  Normal mood and affect. Normal behavior. Normal judgment and thought content.   Assessment and Plan:  Pregnancy: [id number] at [redacted]w[redacted]d  1. Diabetes mellitus affecting pregnancy in third  trimester Blood sugars are within range. Growth scan on 11/30. Antenatal testing to start at 32 weeks. Continue current medication regimen.   2. Previous cesarean section complicating pregnancy, antepartum condition or complication Counseled regarding TOLAC vs RCS; risks/benefits discussed in detail. Had SVD, then C/S x 2.  Talked about increased risk of uterine rupture with two compared to one cesarean section, inability to do cervical ripening/full induction.  All questions answered.  Patient elects for TOLAC, consent signed 11/07/2017.  3. Periumbilical hernia No signs of incarceration today. Continue to monitor closely.  4. Supervision of high risk pregnancy, antepartum - Tdap - RPR - CBC - HIV antibody (with reflex)  Preterm labor symptoms and general obstetric precautions including but not limited to vaginal bleeding, contractions, leaking of fluid and fetal movement were reviewed in detail with the patient. Please refer to After Visit Summary for other counseling recommendations.  Return in about 2 weeks (around 11/21/2017) for OB Visit (HOB) 3 weeks: HOB, NST, BPP (will need weekly BPP and visits after this).Verita Schneiders, MD

## 2017-11-07 NOTE — Patient Instructions (Signed)
Parto vaginal despus de Elmon Else (Vaginal Birth After Cesarean Delivery) Un parto vaginal despus de un parto por cesrea es dar a luz por la vagina despus de haber dado a luz por medio de una intervencin [place]. En el pasado, si una mujer tena un beb por cesrea, todos los partos posteriores deban hacerse por cesrea. Esto ya no es as. Puede ser seguro para la mam intentar un parto vaginal luego de una cesrea. Es importante que converse con su mdico desde comienzos del Media planner de modo que pueda [employer], beneficios y opciones. Le dar tiempo para decidir qu es lo mejor en su caso particular. La decisin final de tener un parto vaginal o por cesrea debe tomarse en conjunto, entre usted y el mdico. Cualquier cambio en su salud o la de su beb durante el embarazo puede ser motivo de un cambio de decisin respecto del parto vaginal. LAS MUJERES QUE OPTAN POR EL PARTO VAGINAL, DEBEN CONSULTAR AL MDICO PARA ASEGURARSE DE QUE:  La cesrea anterior se haya realizado con un corte (incisin) uterino transversal (no con una incisin vertical clsica).  El canal de parto es lo suficientemente grande como para que pase el [place].  No ha sido sometida a otras operaciones del tero.  Durante el trabajo de parto, le realizarn un monitoreo fetal Emergency planning/management officer, en todo momento.  Habr un quirfano disponible y listo en caso de necesitar una cesrea de emergencia.  Un mdico y personal de quirfano estarn disponibles en todo momento durante el [place] de parto, para realizar una cesrea en caso de ser necesario.  Habr un anestesista disponible en caso de necesitar una cesrea de emergencia.  La nursery est lista y cuenta con personal especializado y el equipo disponible para cuidar al beb en caso de emergencia. BENEFICIOS DEL PARTO VAGINAL:  Permanencia ms breve en el hospital.  Prevencin de los riesgos asociados con el parto por cesrea, por ejemplo: ? Complicaciones  quirrgicas, como apertura o hernia de la incisin. ? Lesiones en otros rganos. ? Fiebre. Esto puede ocurrir si aparece una infeccin despus de la ciruga. Tambin puede ocurrir como reaccin a los medicamentos administrados para adormecerla durante la [place].  Menos prdida de sangre y menos probabilidad de necesitar una transfusin sangunea.  Menor riesgo de cogulos sanguneos e infeccin.  Tiempo ms corto de recuperacin.  Menor riesgo de remocin del tero (histerectoma).  Menor riesgo de que la placenta cubra parcial o completamente la abertura del tero (placenta previa) en embarazos futuros.  Menos riesgos en el [place] de parto y [place] futuros. RIESGOS  Ruptura del tero. Esto ocurre en menos del 1% de los partos vaginales. El riesgo de que eso suceda es mayor si: ? Se toman medidas para iniciar el proceso del trabajo de parto (inducir Doctor, hospital) o Educational psychologist o intensificar las contracciones (aumentar el trabajo de [place]). ? Se usan medicamentos para ablandar (madurar) el cuello del tero.  Es necesario extraer el tero (histerectoma) si se rompe. No debe llevarse a cabo si:  La cesrea previa se realiz con una incisin vertical (clsica) o con forma de T, o usted no sabe cul de [employer] han practicado.  Ha sufrido ruptura del tero.  Ha tenido ciertos tipos de Leisure centre manager en el tero, como la extirpacin de fibromas uterinos. Pregntele a su mdico sobre otros tipos de cirugas que le impiden tener un parto vaginal.  Tiene ciertos problemas mdicos o relacionados con el parto (obsttricos).  El beb est en problemas.  Tuvo dos cesreas previas y ningn parto vaginal. OTRAS COSAS QUE DEBE SABER:  La anestesia peridural es segura.  Es seguro dar vuelta al beb si se encuentra de nalgas (intentar una versin ceflica externa).  Es seguro intentarlo en caso de mellizos.  El parto vaginal puede no ser apropiado si el beb pesa 8,8lb (4kg) o ms. Sin embargo,  las predicciones de [place] no son siempre exactas y no deben ser lo nico a tenerse en cuenta para decidir si el parto vaginal es lo indicado para usted.  Hay aumento en el porcentaje de fracasos si el intervalo entre la cesrea y el parto vaginal es de menos de 19 meses.  Su mdico puede aconsejarle no tener un parto vaginal si tiene preeclampsia (hipertensin, protena en la orina e hinchazn en la cara y las extremidades).  El parto vaginal suele ser exitoso si ya tuvo un parto vaginal previamente.  Tambin suele ser exitoso cuando el trabajo de parto comienza espontneamente antes de la fecha.  El parto vaginal despus de Elmon Else es similar a un parto espontneo vaginal normal. Esta informacin no tiene Marine scientist el consejo del mdico. Asegrese de hacerle al mdico cualquier pregunta que tenga. Document Released: 05/22/2008 Document Revised: 09/24/2013 Document Reviewed: 07/03/2013 Elsevier Interactive Patient Education  [employer].

## 2017-11-08 LAB — RPR: RPR Ser Ql: NONREACTIVE

## 2017-11-08 LAB — CBC
HEMATOCRIT: 35.1 % (ref 34.0–46.6)
HEMOGLOBIN: 11.8 g/dL (ref 11.1–15.9)
MCH: 29.7 pg (ref 26.6–33.0)
MCHC: 33.6 g/dL (ref 31.5–35.7)
MCV: 88 fL (ref 79–97)
Platelets: 189 10*3/uL (ref 150–379)
RBC: 3.97 x10E6/uL (ref 3.77–5.28)
RDW: 13.6 % (ref 12.3–15.4)
WBC: 7.1 10*3/uL (ref 3.4–10.8)

## 2017-11-08 LAB — HIV ANTIBODY (ROUTINE TESTING W REFLEX): HIV SCREEN 4TH GENERATION: NONREACTIVE

## 2017-11-15 ENCOUNTER — Encounter: Payer: Self-pay | Admitting: Obstetrics and Gynecology

## 2017-11-16 ENCOUNTER — Encounter (HOSPITAL_COMMUNITY): Payer: Self-pay

## 2017-11-16 ENCOUNTER — Ambulatory Visit (HOSPITAL_COMMUNITY)
Admission: RE | Admit: 2017-11-16 | Discharge: 2017-11-16 | Disposition: A | Discharge status: Home / Self Care | Payer: Self-pay | Source: Ambulatory Visit | Attending: Obstetrics and Gynecology | Admitting: Obstetrics and Gynecology

## 2017-11-16 ENCOUNTER — Other Ambulatory Visit (HOSPITAL_COMMUNITY): Payer: Self-pay | Admitting: Maternal & Fetal Medicine

## 2017-11-16 DIAGNOSIS — O403XX0 Polyhydramnios, third trimester, not applicable or unspecified: Secondary | ICD-10-CM | POA: Insufficient documentation

## 2017-11-16 DIAGNOSIS — O99213 Obesity complicating pregnancy, third trimester: Secondary | ICD-10-CM | POA: Insufficient documentation

## 2017-11-16 DIAGNOSIS — O24415 Gestational diabetes mellitus in pregnancy, controlled by oral hypoglycemic drugs: Secondary | ICD-10-CM | POA: Insufficient documentation

## 2017-11-16 DIAGNOSIS — O09523 Supervision of elderly multigravida, third trimester: Secondary | ICD-10-CM | POA: Insufficient documentation

## 2017-11-16 DIAGNOSIS — Z3A30 30 weeks gestation of pregnancy: Secondary | ICD-10-CM | POA: Insufficient documentation

## 2017-11-16 DIAGNOSIS — O09893 Supervision of other high risk pregnancies, third trimester: Secondary | ICD-10-CM | POA: Insufficient documentation

## 2017-11-16 DIAGNOSIS — Z3689 Encounter for other specified antenatal screening: Secondary | ICD-10-CM | POA: Insufficient documentation

## 2017-11-16 DIAGNOSIS — O09529 Supervision of elderly multigravida, unspecified trimester: Secondary | ICD-10-CM

## 2017-11-16 DIAGNOSIS — O34219 Maternal care for unspecified type scar from previous cesarean delivery: Secondary | ICD-10-CM | POA: Insufficient documentation

## 2017-11-21 ENCOUNTER — Ambulatory Visit: Payer: Self-pay | Admitting: *Deleted

## 2017-11-21 ENCOUNTER — Ambulatory Visit: Payer: Self-pay

## 2017-11-21 ENCOUNTER — Ambulatory Visit (INDEPENDENT_AMBULATORY_CARE_PROVIDER_SITE_OTHER): Payer: Self-pay | Admitting: Family Medicine

## 2017-11-21 VITALS — BP 106/71 | HR 89 | Wt 218.9 lb

## 2017-11-21 DIAGNOSIS — O0990 Supervision of high risk pregnancy, unspecified, unspecified trimester: Secondary | ICD-10-CM

## 2017-11-21 DIAGNOSIS — O24913 Unspecified diabetes mellitus in pregnancy, third trimester: Secondary | ICD-10-CM

## 2017-11-21 DIAGNOSIS — E119 Type 2 diabetes mellitus without complications: Secondary | ICD-10-CM

## 2017-11-21 DIAGNOSIS — E669 Obesity, unspecified: Secondary | ICD-10-CM

## 2017-11-21 DIAGNOSIS — O09523 Supervision of elderly multigravida, third trimester: Secondary | ICD-10-CM

## 2017-11-21 DIAGNOSIS — O0993 Supervision of high risk pregnancy, unspecified, third trimester: Secondary | ICD-10-CM

## 2017-11-21 DIAGNOSIS — O3663X0 Maternal care for excessive fetal growth, third trimester, not applicable or unspecified: Secondary | ICD-10-CM | POA: Insufficient documentation

## 2017-11-21 MED ORDER — FERROUS SULFATE 325 (65 FE) MG PO TABS: 325.0000 mg | ORAL_TABLET | Freq: Two times a day (BID) | ORAL | 1 refills | Status: DC

## 2017-11-21 NOTE — Progress Notes (Signed)
Pt was scheduled to begin fetal testing today however is not indicated until 32 weeks per guideline. Next appt scheduled on 12/12.

## 2017-11-21 NOTE — Progress Notes (Signed)
History:  [name] is a 36 y.o. [phone] who presents to clinic today for ongoing prenatal care.  She is currently monitored for the following issues for this high-risk pregnancy and has obesity, class II, BMI 35-39.9; Lipoma of lower extremity; Hyperlipidemia LDL goal < 160; Depression; Osteoarthritis; Elevated transaminase level; Knee meniscus pain; Diabetes mellitus type 2, uncomplicated ([place]); Periumbilical hernia; Supervision of high risk pregnancy, antepartum; Diabetes mellitus affecting pregnancy; AMA (advanced maternal age) multigravida 35+; and Previous cesarean section complicating pregnancy, antepartum condition or complication on their problem list.  The following portions of the patient's history were reviewed and updated as appropriate: allergies, current medications, family history, past medical history, social history, past surgical history and problem list.    Objective:  Physical Exam BP 106/71   Pulse 89   Wt 218 lb 14.4 oz (99.3 kg)   LMP 04/14/2017   BMI 36.43 kg/m    Fetal Status: Fetal Heart Rate (bpm): 154 Fundal Height: 36 cm Movement: Present     General:  Alert, oriented and cooperative. Patient is in no acute distress.  Skin: Skin is warm and dry. No rash noted.   Cardiovascular: Normal heart rate noted  Respiratory: Normal respiratory effort, no problems with respiration noted  Abdomen: Soft, gravid, appropriate for gestational age.  Pain/Pressure: Present     Pelvic: Cervical exam deferred        Extremities: Normal range of motion.  Edema: None  Mental Status:  Normal mood and affect. Normal behavior. Normal judgment and thought content.    Labs and Imaging  MFM U/S 11/21 Impression:  Julie [name] intrauterine pregnancy at 30 weeks 6 days  gestation with fetal cardiac activity  Cephalic presentation  Posterior placenta  Estimate fetal weight >90th centile and AC > 97th centile  AFI > 28 cm  Assessment & Plan:   1. Supervision of  high risk pregnancy, antepartum Patient reports 'restless legs' at night. She thinks that this is related to her diabetes medication.  She has tried not taking these two nights a week, and has seen improvement on these nights. I encouraged her to continue taking her diabetes medication as recommended, and to start ferrous sulfate 325mg  BID to treat restless leg sensation.  2. Type 2 diabetes mellitus without complication, without long-term current use of insulin ([place]) She resports that her fasting sugars are 90 - 100, and her PP sugars are 100 - 110.  She continues to take metformin and glyburide BID. Start antenatal testing next week. Follow-up growth [place] in four weeks.   3. Excessive fetal growth affecting management of pregnancy in third trimester, single or unspecified fetus She is very concerned about the results of her ultrasound (above).  She reports that the [place] doctor told her that they would have to do a C-section "several weeks" early. Patient desires TOLAC if possible.    Carlean Purl, Medical Student 11/21/2017 3:34 PM

## 2017-11-28 ENCOUNTER — Encounter: Payer: Self-pay | Admitting: Obstetrics & Gynecology

## 2017-11-28 ENCOUNTER — Telehealth: Payer: Self-pay | Admitting: General Practice

## 2017-11-28 ENCOUNTER — Other Ambulatory Visit: Payer: Self-pay

## 2017-11-28 NOTE — Telephone Encounter (Signed)
Patient called and wanted to cancel appointment.  Attempted to reschedule for tomorrow and patient stated that she will just keep her appointment that she has scheduled on 12/05/17.  Advised patient in any event that she has complications, to please visit our Maternity Admissions.

## 2017-12-03 ENCOUNTER — Other Ambulatory Visit: Payer: Self-pay | Admitting: *Deleted

## 2017-12-03 DIAGNOSIS — O24113 Pre-existing diabetes mellitus, type 2, in pregnancy, third trimester: Secondary | ICD-10-CM

## 2017-12-03 DIAGNOSIS — O09523 Supervision of elderly multigravida, third trimester: Secondary | ICD-10-CM

## 2017-12-05 ENCOUNTER — Ambulatory Visit: Payer: Self-pay

## 2017-12-05 ENCOUNTER — Ambulatory Visit (INDEPENDENT_AMBULATORY_CARE_PROVIDER_SITE_OTHER): Payer: Self-pay | Admitting: Obstetrics and Gynecology

## 2017-12-05 ENCOUNTER — Ambulatory Visit (INDEPENDENT_AMBULATORY_CARE_PROVIDER_SITE_OTHER): Payer: Self-pay | Admitting: *Deleted

## 2017-12-05 ENCOUNTER — Encounter: Payer: Self-pay | Admitting: Obstetrics and Gynecology

## 2017-12-05 VITALS — BP 118/78 | HR 81 | Wt 219.6 lb

## 2017-12-05 DIAGNOSIS — O24913 Unspecified diabetes mellitus in pregnancy, third trimester: Secondary | ICD-10-CM

## 2017-12-05 DIAGNOSIS — O0990 Supervision of high risk pregnancy, unspecified, unspecified trimester: Secondary | ICD-10-CM

## 2017-12-05 DIAGNOSIS — O09523 Supervision of elderly multigravida, third trimester: Secondary | ICD-10-CM

## 2017-12-05 DIAGNOSIS — E669 Obesity, unspecified: Secondary | ICD-10-CM

## 2017-12-05 DIAGNOSIS — O34219 Maternal care for unspecified type scar from previous cesarean delivery: Secondary | ICD-10-CM

## 2017-12-05 DIAGNOSIS — R7401 Elevation of levels of liver transaminase levels: Secondary | ICD-10-CM

## 2017-12-05 DIAGNOSIS — O3663X0 Maternal care for excessive fetal growth, third trimester, not applicable or unspecified: Secondary | ICD-10-CM

## 2017-12-05 DIAGNOSIS — R740 Nonspecific elevation of levels of transaminase and lactic acid dehydrogenase [LDH]: Secondary | ICD-10-CM

## 2017-12-05 DIAGNOSIS — R21 Rash and other nonspecific skin eruption: Secondary | ICD-10-CM

## 2017-12-05 MED ORDER — METFORMIN HCL 500 MG PO TABS: ORAL_TABLET | ORAL | 3 refills | Status: DC

## 2017-12-05 NOTE — Progress Notes (Signed)
Prenatal Visit Note Date: 12/05/2017 Clinic: [facility]  Subjective:  Julie [name] is a 36 y.o. [phone] at [redacted]w[redacted]d being seen today for ongoing prenatal care.  She is currently monitored for the following issues for this high-risk pregnancy and has Obesity, Class II, BMI 35-39.9; Lipoma of lower extremity; Hyperlipidemia LDL goal < 160; Depression; Osteoarthritis; Elevated transaminase level; Knee meniscus pain; Diabetes mellitus type 2, uncomplicated ([place]); Periumbilical hernia; Supervision of high risk pregnancy, antepartum; Diabetes mellitus affecting pregnancy; AMA (advanced maternal age) multigravida 35+; Previous cesarean section complicating pregnancy, antepartum condition or complication; and Excessive fetal growth affecting management of mother in third trimester, antepartum on their problem list.  Patient reports no complaints.   Contractions: Not present. Vag. Bleeding: None.  Movement: Present. Denies leaking of fluid.   The following portions of the patient's history were reviewed and updated as appropriate: allergies, current medications, past family history, past medical history, past social history, past surgical history and problem list. Problem list updated.  Objective:   Vitals:   12/05/17 1517  BP: 118/78  Pulse: 81  Weight: 219 lb 9.6 oz (99.6 kg)    Fetal Status: Fetal Heart Rate (bpm): nst   Movement: Present     General:  Alert, oriented and cooperative. Patient is in no acute distress.  Skin: Skin is warm and dry. No rash noted.   Cardiovascular: Normal heart rate noted  Respiratory: Normal respiratory effort, no problems with respiration noted  Abdomen: Soft, gravid, appropriate for gestational age. Pain/Pressure: Present     Pelvic:  Cervical exam deferred        Extremities: Normal range of motion.  Edema: Trace  Mental Status: Normal mood and affect. Normal behavior. Normal judgment and thought content.   Urinalysis:       Assessment and Plan:  Pregnancy: [id number] at [redacted]w[redacted]d  1. Diabetes mellitus affecting pregnancy in third trimester Patient didn't bring book or meter today. She gives am fasting in the 100s-110s and she states she does 1 hour PP checks and states they are in the 120s; I clarified with her and she states that it's 1hr PP that she's doing, which means that 120s is normal. She is currently on metformin 500/500 with breakfast and qhs and glyburide 5/5 at the same time. I told her to increase to metformin 1000/1000 and keep the glyburide at 5/5 but I told her to take her evening dose of pills with dinner and not qhs. D/w her importance of good BS control. BPP 8/10 for NR NST, AFI 18, cephalic.  Continue with weekly testing. Has repeat growth already scheduled for next week - Hemoglobin A1c  2. Supervision of high risk pregnancy, antepartum Unsure of BC. Follow up at [facility] - Hemoglobin A1c - CMP and Liver  3. Elderly multigravida in third trimester Declined genetics  4. Previous cesarean section complicating pregnancy, antepartum condition or complication Pt states she has 1st delivery that was 10lbs and no issues with VD. She is unsure why she had a c-section the 1st time; she states she was told she needed one in the office and so had a scheduled primary and unsure why she was preterm. She then had a repeat even though she states she didn't want one; we only have the 2nd op note and it was a LTCS due to patient desire in the op note. Pt still desires tolace and previously d/w her and consent already signed. F/u at future visits  5. Excessive fetal growth affecting management  of pregnancy in third trimester, single or unspecified fetus F/u rpt u/s later this week   6. Obesity, Class II, BMI 35-39.9  7. Elevated transaminase level - CMP and Liver  Preterm labor symptoms and general obstetric precautions including but not limited to vaginal bleeding, contractions, leaking of fluid and fetal movement  were reviewed in detail with the patient. Please refer to After Visit Summary for other counseling recommendations.  Return in about 1 week (around 12/12/2017).   Aletha Halim, MD

## 2017-12-05 NOTE — Progress Notes (Signed)

## 2017-12-06 LAB — CMP AND LIVER
ALK PHOS: 145 IU/L — AB (ref 39–117)
ALT: 19 IU/L (ref 0–32)
AST: 31 IU/L (ref 0–40)
Albumin: 3.3 g/dL — ABNORMAL LOW (ref 3.5–5.5)
BILIRUBIN, DIRECT: 0.12 mg/dL (ref 0.00–0.40)
BUN: 6 mg/dL (ref 6–20)
Bilirubin Total: 0.3 mg/dL (ref 0.0–1.2)
CO2: 17 mmol/L — AB (ref 20–29)
Calcium: 9 mg/dL (ref 8.7–10.2)
Chloride: 103 mmol/L (ref 96–106)
Creatinine, Ser: 0.5 mg/dL — ABNORMAL LOW (ref 0.57–1.00)
GFR calc Af Amer: 143 mL/min/{1.73_m2} (ref >59)
GFR calc non Af Amer: 124 mL/min/{1.73_m2} (ref >59)
Glucose: 208 mg/dL — ABNORMAL HIGH (ref 65–99)
Potassium: 4.7 mmol/L (ref 3.5–5.2)
Sodium: 134 mmol/L (ref 134–144)
Total Protein: 6.4 g/dL (ref 6.0–8.5)

## 2017-12-06 LAB — HEMOGLOBIN A1C
Est. average glucose Bld gHb Est-mCnc: 206 mg/dL
HEMOGLOBIN A1C: 8.8 % — AB (ref 4.8–5.6)

## 2017-12-12 ENCOUNTER — Other Ambulatory Visit: Payer: Self-pay

## 2017-12-12 ENCOUNTER — Telehealth: Payer: Self-pay | Admitting: General Practice

## 2017-12-12 ENCOUNTER — Encounter: Payer: Self-pay | Admitting: Obstetrics and Gynecology

## 2017-12-12 NOTE — Telephone Encounter (Signed)
Patient no showed for NST appt today. Called patient and she states she already called to reschedule the appt. Patient had no questions

## 2017-12-13 ENCOUNTER — Inpatient Hospital Stay (HOSPITAL_COMMUNITY)
Admission: AD | Admit: 2017-12-13 | Discharge: 2017-12-16 | DRG: 786 | Disposition: A | Discharge status: Home / Self Care | Payer: Medicaid Other | Source: Ambulatory Visit | Attending: Family Medicine | Admitting: Family Medicine

## 2017-12-13 DIAGNOSIS — O24912 Unspecified diabetes mellitus in pregnancy, second trimester: Secondary | ICD-10-CM

## 2017-12-13 DIAGNOSIS — O09523 Supervision of elderly multigravida, third trimester: Secondary | ICD-10-CM

## 2017-12-13 DIAGNOSIS — Z7984 Long term (current) use of oral hypoglycemic drugs: Secondary | ICD-10-CM

## 2017-12-13 DIAGNOSIS — O2412 Pre-existing diabetes mellitus, type 2, in childbirth: Secondary | ICD-10-CM | POA: Diagnosis present

## 2017-12-13 DIAGNOSIS — O24919 Unspecified diabetes mellitus in pregnancy, unspecified trimester: Secondary | ICD-10-CM | POA: Diagnosis present

## 2017-12-13 DIAGNOSIS — O3663X0 Maternal care for excessive fetal growth, third trimester, not applicable or unspecified: Secondary | ICD-10-CM | POA: Diagnosis present

## 2017-12-13 DIAGNOSIS — O34219 Maternal care for unspecified type scar from previous cesarean delivery: Secondary | ICD-10-CM | POA: Diagnosis present

## 2017-12-13 DIAGNOSIS — O09529 Supervision of elderly multigravida, unspecified trimester: Secondary | ICD-10-CM

## 2017-12-13 DIAGNOSIS — O34211 Maternal care for low transverse scar from previous cesarean delivery: Secondary | ICD-10-CM | POA: Diagnosis present

## 2017-12-13 DIAGNOSIS — Z87891 Personal history of nicotine dependence: Secondary | ICD-10-CM

## 2017-12-13 DIAGNOSIS — E119 Type 2 diabetes mellitus without complications: Secondary | ICD-10-CM | POA: Diagnosis present

## 2017-12-13 DIAGNOSIS — Z3A34 34 weeks gestation of pregnancy: Secondary | ICD-10-CM

## 2017-12-13 DIAGNOSIS — O24913 Unspecified diabetes mellitus in pregnancy, third trimester: Secondary | ICD-10-CM

## 2017-12-13 DIAGNOSIS — Z9889 Other specified postprocedural states: Secondary | ICD-10-CM

## 2017-12-13 DIAGNOSIS — O288 Other abnormal findings on antenatal screening of mother: Secondary | ICD-10-CM

## 2017-12-13 DIAGNOSIS — O368130 Decreased fetal movements, third trimester, not applicable or unspecified: Principal | ICD-10-CM | POA: Diagnosis present

## 2017-12-13 DIAGNOSIS — R21 Rash and other nonspecific skin eruption: Secondary | ICD-10-CM

## 2017-12-13 DIAGNOSIS — Z98891 History of uterine scar from previous surgery: Secondary | ICD-10-CM

## 2017-12-13 NOTE — MAU Note (Addendum)
PT PRESENTS SAYING LAST TIME SHE FELT BABY MOVE WAS Tuesday NIGHT .    DENIES HSV AND  MRSA.  [place]  IN CLINIC  . PLANS  FOR VBAC

## 2017-12-14 ENCOUNTER — Other Ambulatory Visit: Payer: Self-pay

## 2017-12-14 ENCOUNTER — Encounter (HOSPITAL_COMMUNITY)
Admission: AD | Disposition: A | Discharge status: Home / Self Care | Payer: Self-pay | Source: Ambulatory Visit | Attending: Family Medicine

## 2017-12-14 ENCOUNTER — Encounter (HOSPITAL_COMMUNITY): Payer: Self-pay | Admitting: *Deleted

## 2017-12-14 ENCOUNTER — Inpatient Hospital Stay (HOSPITAL_COMMUNITY): Payer: Medicaid Other

## 2017-12-14 ENCOUNTER — Ambulatory Visit (HOSPITAL_COMMUNITY): Admission: RE | Admit: 2017-12-14 | Payer: Self-pay | Source: Ambulatory Visit

## 2017-12-14 ENCOUNTER — Inpatient Hospital Stay (HOSPITAL_COMMUNITY): Payer: Medicaid Other | Admitting: Anesthesiology

## 2017-12-14 DIAGNOSIS — E119 Type 2 diabetes mellitus without complications: Secondary | ICD-10-CM | POA: Diagnosis present

## 2017-12-14 DIAGNOSIS — O2412 Pre-existing diabetes mellitus, type 2, in childbirth: Secondary | ICD-10-CM | POA: Diagnosis present

## 2017-12-14 DIAGNOSIS — O34211 Maternal care for low transverse scar from previous cesarean delivery: Secondary | ICD-10-CM | POA: Diagnosis present

## 2017-12-14 DIAGNOSIS — O368130 Decreased fetal movements, third trimester, not applicable or unspecified: Secondary | ICD-10-CM | POA: Diagnosis present

## 2017-12-14 DIAGNOSIS — Z7984 Long term (current) use of oral hypoglycemic drugs: Secondary | ICD-10-CM | POA: Diagnosis not present

## 2017-12-14 DIAGNOSIS — Z87891 Personal history of nicotine dependence: Secondary | ICD-10-CM | POA: Diagnosis not present

## 2017-12-14 DIAGNOSIS — Z3A34 34 weeks gestation of pregnancy: Secondary | ICD-10-CM | POA: Diagnosis not present

## 2017-12-14 DIAGNOSIS — Z98891 History of uterine scar from previous surgery: Secondary | ICD-10-CM

## 2017-12-14 LAB — WET PREP, GENITAL
Sperm: NONE SEEN
Trich, Wet Prep: NONE SEEN
Yeast Wet Prep HPF POC: NONE SEEN

## 2017-12-14 LAB — COMPREHENSIVE METABOLIC PANEL
ALT: 21 U/L (ref 14–54)
ANION GAP: 12 (ref 5–15)
AST: 32 U/L (ref 15–41)
Albumin: 2.6 g/dL — ABNORMAL LOW (ref 3.5–5.0)
Alkaline Phosphatase: 167 U/L — ABNORMAL HIGH (ref 38–126)
BUN: 13 mg/dL (ref 6–20)
CO2: 17 mmol/L — AB (ref 22–32)
CREATININE: 0.51 mg/dL (ref 0.44–1.00)
Calcium: 8.9 mg/dL (ref 8.9–10.3)
Chloride: 102 mmol/L (ref 101–111)
GFR calc non Af Amer: >60 mL/min (ref >60)
Glucose, Bld: 284 mg/dL — ABNORMAL HIGH (ref 65–99)
POTASSIUM: 4 mmol/L (ref 3.5–5.1)
SODIUM: 131 mmol/L — AB (ref 135–145)
Total Bilirubin: 0.3 mg/dL (ref 0.3–1.2)
Total Protein: 6.3 g/dL — ABNORMAL LOW (ref 6.5–8.1)

## 2017-12-14 LAB — GLUCOSE, CAPILLARY
GLUCOSE-CAPILLARY: 187 mg/dL — AB (ref 65–99)
GLUCOSE-CAPILLARY: 174 mg/dL — AB (ref 65–99)
GLUCOSE-CAPILLARY: 209 mg/dL — AB (ref 65–99)
Glucose-Capillary: 175 mg/dL — ABNORMAL HIGH (ref 65–99)
Glucose-Capillary: 176 mg/dL — ABNORMAL HIGH (ref 65–99)
Glucose-Capillary: 275 mg/dL — ABNORMAL HIGH (ref 65–99)

## 2017-12-14 LAB — CBC
HCT: 33.1 % — ABNORMAL LOW (ref 36.0–46.0)
HEMATOCRIT: 32.4 % — AB (ref 36.0–46.0)
Hemoglobin: 11.1 g/dL — ABNORMAL LOW (ref 12.0–15.0)
Hemoglobin: 10.6 g/dL — ABNORMAL LOW (ref 12.0–15.0)
MCH: 29.1 pg (ref 26.0–34.0)
MCH: 28.3 pg (ref 26.0–34.0)
MCHC: 33.5 g/dL (ref 30.0–36.0)
MCHC: 32.7 g/dL (ref 30.0–36.0)
MCV: 86.9 fL (ref 78.0–100.0)
MCV: 86.6 fL (ref 78.0–100.0)
PLATELETS: 183 10*3/uL (ref 150–400)
Platelets: 167 10*3/uL (ref 150–400)
RBC: 3.81 MIL/uL — AB (ref 3.87–5.11)
RBC: 3.74 MIL/uL — ABNORMAL LOW (ref 3.87–5.11)
RDW: 13.1 % (ref 11.5–15.5)
RDW: 13.1 % (ref 11.5–15.5)
WBC: 7.5 10*3/uL (ref 4.0–10.5)
WBC: 11.9 10*3/uL — ABNORMAL HIGH (ref 4.0–10.5)

## 2017-12-14 LAB — TYPE AND SCREEN
ABO/RH(D): O POS
ANTIBODY SCREEN: NEGATIVE

## 2017-12-14 LAB — RPR: RPR Ser Ql: NONREACTIVE

## 2017-12-14 SURGERY — Surgical Case
Anesthesia: Spinal

## 2017-12-14 MED ORDER — DIPHENHYDRAMINE HCL 50 MG/ML IJ SOLN: 12.5000 mg | INTRAMUSCULAR | Status: DC | PRN

## 2017-12-14 MED ORDER — PROMETHAZINE HCL 25 MG/ML IJ SOLN
6.2500 mg | INTRAMUSCULAR | Status: DC | PRN
Start: 2017-12-14 — End: 2017-12-14

## 2017-12-14 MED ORDER — HYDROMORPHONE HCL 1 MG/ML IJ SOLN
INTRAMUSCULAR | Status: AC
  Filled 2017-12-14: qty 0.5

## 2017-12-14 MED ORDER — OXYTOCIN 10 UNIT/ML IJ SOLN
INTRAMUSCULAR | Status: AC
  Filled 2017-12-14: qty 4

## 2017-12-14 MED ORDER — OXYTOCIN 40 UNITS IN LACTATED RINGERS INFUSION - SIMPLE MED: 2.5000 [IU]/h | INTRAVENOUS | Status: AC

## 2017-12-14 MED ORDER — SODIUM CHLORIDE 0.9% FLUSH: 3.0000 mL | INTRAVENOUS | Status: DC | PRN

## 2017-12-14 MED ORDER — LACTATED RINGERS IV BOLUS (SEPSIS)
1000.0000 mL | Freq: Once | INTRAVENOUS | Status: AC
  Administered 2017-12-14: 1000 mL via INTRAVENOUS

## 2017-12-14 MED ORDER — LACTATED RINGERS IV SOLN
INTRAVENOUS | Status: DC | PRN
  Administered 2017-12-14: 40 [IU] via INTRAVENOUS

## 2017-12-14 MED ORDER — DIBUCAINE 1 % RE OINT: 1.0000 "application " | TOPICAL_OINTMENT | RECTAL | Status: DC | PRN

## 2017-12-14 MED ORDER — OXYCODONE-ACETAMINOPHEN 5-325 MG PO TABS
1.0000 | ORAL_TABLET | ORAL | Status: DC | PRN
  Filled 2017-12-14: qty 1

## 2017-12-14 MED ORDER — CEFAZOLIN SODIUM-DEXTROSE 2-4 GM/100ML-% IV SOLN: 2.0000 g | Freq: Once | INTRAVENOUS | Status: DC

## 2017-12-14 MED ORDER — NALOXONE HCL 0.4 MG/ML IJ SOLN
1.0000 ug/kg/h | INTRAVENOUS | Status: DC | PRN
  Filled 2017-12-14: qty 5

## 2017-12-14 MED ORDER — FERROUS SULFATE 325 (65 FE) MG PO TABS
325.0000 mg | ORAL_TABLET | Freq: Two times a day (BID) | ORAL | Status: DC
  Administered 2017-12-14 – 2017-12-15 (×3): 325 mg via ORAL
  Filled 2017-12-14 (×3): qty 1

## 2017-12-14 MED ORDER — KETOROLAC TROMETHAMINE 30 MG/ML IJ SOLN
30.0000 mg | Freq: Once | INTRAMUSCULAR | Status: DC | PRN
  Administered 2017-12-14: 30 mg via INTRAVENOUS

## 2017-12-14 MED ORDER — MEPERIDINE HCL 25 MG/ML IJ SOLN: 6.2500 mg | INTRAMUSCULAR | Status: DC | PRN

## 2017-12-14 MED ORDER — SCOPOLAMINE 1 MG/3DAYS TD PT72
MEDICATED_PATCH | TRANSDERMAL | Status: AC
  Filled 2017-12-14: qty 1

## 2017-12-14 MED ORDER — PRENATAL MULTIVITAMIN CH
1.0000 | ORAL_TABLET | Freq: Every day | ORAL | Status: DC
  Administered 2017-12-14 – 2017-12-16 (×3): 1 via ORAL
  Filled 2017-12-14 (×3): qty 1

## 2017-12-14 MED ORDER — OXYCODONE-ACETAMINOPHEN 5-325 MG PO TABS
2.0000 | ORAL_TABLET | ORAL | Status: DC | PRN
  Administered 2017-12-16: 2 via ORAL
  Filled 2017-12-14: qty 2

## 2017-12-14 MED ORDER — SIMETHICONE 80 MG PO CHEW: 80.0000 mg | CHEWABLE_TABLET | ORAL | Status: DC | PRN

## 2017-12-14 MED ORDER — LACTATED RINGERS IV SOLN
INTRAVENOUS | Status: DC
  Administered 2017-12-14 (×2): via INTRAVENOUS

## 2017-12-14 MED ORDER — WITCH HAZEL-GLYCERIN EX PADS: 1.0000 "application " | MEDICATED_PAD | CUTANEOUS | Status: DC | PRN

## 2017-12-14 MED ORDER — NALOXONE HCL 0.4 MG/ML IJ SOLN: 0.4000 mg | INTRAMUSCULAR | Status: DC | PRN

## 2017-12-14 MED ORDER — SCOPOLAMINE 1 MG/3DAYS TD PT72
1.0000 | MEDICATED_PATCH | Freq: Once | TRANSDERMAL | Status: DC
  Administered 2017-12-14: 1.5 mg via TRANSDERMAL
  Filled 2017-12-14: qty 1

## 2017-12-14 MED ORDER — LACTATED RINGERS IV SOLN
INTRAVENOUS | Status: DC
  Administered 2017-12-14: 10:00:00 via INTRAVENOUS

## 2017-12-14 MED ORDER — MENTHOL 3 MG MT LOZG: 1.0000 | LOZENGE | OROMUCOSAL | Status: DC | PRN

## 2017-12-14 MED ORDER — ONDANSETRON HCL 4 MG/2ML IJ SOLN
INTRAMUSCULAR | Status: DC | PRN
  Administered 2017-12-14: 4 mg via INTRAVENOUS

## 2017-12-14 MED ORDER — SOD CITRATE-CITRIC ACID 500-334 MG/5ML PO SOLN
30.0000 mL | Freq: Once | ORAL | Status: AC
  Administered 2017-12-14: 30 mL via ORAL
  Filled 2017-12-14: qty 15

## 2017-12-14 MED ORDER — PHENYLEPHRINE 8 MG IN D5W 100 ML (0.08MG/ML) PREMIX OPTIME
INJECTION | INTRAVENOUS | Status: DC | PRN
  Administered 2017-12-14: 60 ug/min via INTRAVENOUS

## 2017-12-14 MED ORDER — ACETAMINOPHEN 325 MG PO TABS
650.0000 mg | ORAL_TABLET | ORAL | Status: DC | PRN
Start: 2017-12-14 — End: 2017-12-16

## 2017-12-14 MED ORDER — TETANUS-DIPHTH-ACELL PERTUSSIS 5-2.5-18.5 LF-MCG/0.5 IM SUSP: 0.5000 mL | Freq: Once | INTRAMUSCULAR | Status: DC

## 2017-12-14 MED ORDER — GLYBURIDE 5 MG PO TABS
5.0000 mg | ORAL_TABLET | Freq: Every day | ORAL | Status: DC
  Administered 2017-12-15 – 2017-12-16 (×2): 5 mg via ORAL
  Filled 2017-12-14 (×2): qty 1

## 2017-12-14 MED ORDER — FAMOTIDINE IN NACL 20-0.9 MG/50ML-% IV SOLN
20.0000 mg | Freq: Once | INTRAVENOUS | Status: AC
  Administered 2017-12-14: 20 mg via INTRAVENOUS
  Filled 2017-12-14: qty 50

## 2017-12-14 MED ORDER — SIMETHICONE 80 MG PO CHEW
80.0000 mg | CHEWABLE_TABLET | ORAL | Status: DC
  Administered 2017-12-14: 80 mg via ORAL
  Filled 2017-12-14: qty 1

## 2017-12-14 MED ORDER — ONDANSETRON HCL 4 MG/2ML IJ SOLN: 4.0000 mg | Freq: Three times a day (TID) | INTRAMUSCULAR | Status: DC | PRN

## 2017-12-14 MED ORDER — NALBUPHINE HCL 10 MG/ML IJ SOLN: 5.0000 mg | Freq: Once | INTRAMUSCULAR | Status: DC | PRN

## 2017-12-14 MED ORDER — IBUPROFEN 600 MG PO TABS
600.0000 mg | ORAL_TABLET | Freq: Four times a day (QID) | ORAL | Status: DC
  Administered 2017-12-14 – 2017-12-16 (×9): 600 mg via ORAL
  Filled 2017-12-14 (×9): qty 1

## 2017-12-14 MED ORDER — SIMETHICONE 80 MG PO CHEW
80.0000 mg | CHEWABLE_TABLET | Freq: Three times a day (TID) | ORAL | Status: DC
  Administered 2017-12-14 – 2017-12-16 (×7): 80 mg via ORAL
  Filled 2017-12-14 (×7): qty 1

## 2017-12-14 MED ORDER — KETOROLAC TROMETHAMINE 30 MG/ML IJ SOLN
INTRAMUSCULAR | Status: AC
  Filled 2017-12-14: qty 1

## 2017-12-14 MED ORDER — MORPHINE SULFATE (PF) 0.5 MG/ML IJ SOLN
INTRAMUSCULAR | Status: DC | PRN
  Administered 2017-12-14: .2 mg via EPIDURAL

## 2017-12-14 MED ORDER — ZOLPIDEM TARTRATE 5 MG PO TABS: 5.0000 mg | ORAL_TABLET | Freq: Every evening | ORAL | Status: DC | PRN

## 2017-12-14 MED ORDER — HYDROMORPHONE HCL 1 MG/ML IJ SOLN
INTRAMUSCULAR | Status: AC
  Filled 2017-12-14: qty 1

## 2017-12-14 MED ORDER — ENOXAPARIN SODIUM 40 MG/0.4ML ~~LOC~~ SOLN
40.0000 mg | SUBCUTANEOUS | Status: DC
  Administered 2017-12-14 – 2017-12-15 (×2): 40 mg via SUBCUTANEOUS
  Filled 2017-12-14 (×3): qty 0.4

## 2017-12-14 MED ORDER — FENTANYL CITRATE (PF) 100 MCG/2ML IJ SOLN
INTRAMUSCULAR | Status: AC
  Filled 2017-12-14: qty 2

## 2017-12-14 MED ORDER — CEFAZOLIN SODIUM-DEXTROSE 2-4 GM/100ML-% IV SOLN
2.0000 g | INTRAVENOUS | Status: AC
  Administered 2017-12-14: 2 g via INTRAVENOUS
  Filled 2017-12-14: qty 100

## 2017-12-14 MED ORDER — FAMOTIDINE IN NACL 20-0.9 MG/50ML-% IV SOLN
INTRAVENOUS | Status: AC
Start: 2017-12-14 — End: 2017-12-14
  Filled 2017-12-14: qty 50

## 2017-12-14 MED ORDER — ONDANSETRON HCL 4 MG/2ML IJ SOLN
INTRAMUSCULAR | Status: AC
  Filled 2017-12-14: qty 2

## 2017-12-14 MED ORDER — INSULIN ASPART 100 UNIT/ML ~~LOC~~ SOLN
0.0000 [IU] | Freq: Three times a day (TID) | SUBCUTANEOUS | Status: DC
  Administered 2017-12-14 (×2): 3 [IU] via SUBCUTANEOUS

## 2017-12-14 MED ORDER — HYDROMORPHONE HCL 1 MG/ML IJ SOLN
INTRAMUSCULAR | Status: DC | PRN
  Administered 2017-12-14: 1 mg via INTRAVENOUS

## 2017-12-14 MED ORDER — DIPHENHYDRAMINE HCL 25 MG PO CAPS: 25.0000 mg | ORAL_CAPSULE | ORAL | Status: DC | PRN

## 2017-12-14 MED ORDER — METFORMIN HCL 500 MG PO TABS
1000.0000 mg | ORAL_TABLET | Freq: Two times a day (BID) | ORAL | Status: DC
  Administered 2017-12-14 – 2017-12-16 (×5): 1000 mg via ORAL
  Filled 2017-12-14 (×5): qty 2

## 2017-12-14 MED ORDER — MORPHINE SULFATE (PF) 0.5 MG/ML IJ SOLN
INTRAMUSCULAR | Status: AC
  Filled 2017-12-14: qty 10

## 2017-12-14 MED ORDER — INSULIN ASPART 100 UNIT/ML ~~LOC~~ SOLN
0.0000 [IU] | Freq: Every day | SUBCUTANEOUS | Status: DC
  Administered 2017-12-14: 2 [IU] via SUBCUTANEOUS
  Administered 2017-12-15: 3 [IU] via SUBCUTANEOUS

## 2017-12-14 MED ORDER — COCONUT OIL OIL: 1.0000 "application " | TOPICAL_OIL | Status: DC | PRN

## 2017-12-14 MED ORDER — DIPHENHYDRAMINE HCL 25 MG PO CAPS: 25.0000 mg | ORAL_CAPSULE | Freq: Four times a day (QID) | ORAL | Status: DC | PRN

## 2017-12-14 MED ORDER — HYDROMORPHONE HCL 1 MG/ML IJ SOLN
0.2500 mg | INTRAMUSCULAR | Status: DC | PRN
Start: 2017-12-14 — End: 2017-12-14
  Administered 2017-12-14 (×3): 0.5 mg via INTRAVENOUS

## 2017-12-14 MED ORDER — BUPIVACAINE IN DEXTROSE 0.75-8.25 % IT SOLN
INTRATHECAL | Status: DC | PRN
  Administered 2017-12-14: 1.5 mL via INTRATHECAL

## 2017-12-14 MED ORDER — SENNOSIDES-DOCUSATE SODIUM 8.6-50 MG PO TABS
2.0000 | ORAL_TABLET | ORAL | Status: DC
  Administered 2017-12-14: 2 via ORAL
  Filled 2017-12-14: qty 2

## 2017-12-14 MED ORDER — SCOPOLAMINE 1 MG/3DAYS TD PT72
MEDICATED_PATCH | TRANSDERMAL | Status: DC | PRN
  Administered 2017-12-14: 1 via TRANSDERMAL

## 2017-12-14 MED ORDER — NALBUPHINE HCL 10 MG/ML IJ SOLN: 5.0000 mg | INTRAMUSCULAR | Status: DC | PRN

## 2017-12-14 MED ORDER — NALBUPHINE HCL 10 MG/ML IJ SOLN
5.0000 mg | INTRAMUSCULAR | Status: DC | PRN
Start: 2017-12-14 — End: 2017-12-15

## 2017-12-14 SURGICAL SUPPLY — 33 items
BENZOIN TINCTURE PRP APPL 2/3 (GAUZE/BANDAGES/DRESSINGS) ×3 IMPLANT
CHLORAPREP W/TINT 26ML (MISCELLANEOUS) ×3 IMPLANT
CLAMP CORD UMBIL (MISCELLANEOUS) IMPLANT
CLOSURE WOUND 1/2 X4 (GAUZE/BANDAGES/DRESSINGS) ×1
CLOTH BEACON ORANGE TIMEOUT ST (SAFETY) ×3 IMPLANT
DRSG OPSITE POSTOP 4X10 (GAUZE/BANDAGES/DRESSINGS) ×3 IMPLANT
ELECT REM PT RETURN 9FT ADLT (ELECTROSURGICAL) ×3
ELECTRODE REM PT RTRN 9FT ADLT (ELECTROSURGICAL) ×1 IMPLANT
EXTRACTOR VACUUM M CUP 4 TUBE (SUCTIONS) IMPLANT
EXTRACTOR VACUUM M CUP 4' TUBE (SUCTIONS)
GAUZE SPONGE 4X4 12PLY STRL LF (GAUZE/BANDAGES/DRESSINGS) ×3 IMPLANT
GLOVE BIOGEL PI IND STRL 7.0 (GLOVE) ×2 IMPLANT
GLOVE BIOGEL PI IND STRL 7.5 (GLOVE) ×2 IMPLANT
GLOVE BIOGEL PI INDICATOR 7.0 (GLOVE) ×4
GLOVE BIOGEL PI INDICATOR 7.5 (GLOVE) ×4
GLOVE ECLIPSE 7.5 STRL STRAW (GLOVE) ×3 IMPLANT
GOWN STRL REUS W/TWL LRG LVL3 (GOWN DISPOSABLE) ×9 IMPLANT
KIT ABG SYR 3ML LUER SLIP (SYRINGE) IMPLANT
NEEDLE HYPO 25X5/8 SAFETYGLIDE (NEEDLE) IMPLANT
NS IRRIG 1000ML POUR BTL (IV SOLUTION) ×3 IMPLANT
PACK C SECTION WH (CUSTOM PROCEDURE TRAY) ×3 IMPLANT
PAD ABD 7.5X8 STRL (GAUZE/BANDAGES/DRESSINGS) ×3 IMPLANT
PAD OB MATERNITY 4.3X12.25 (PERSONAL CARE ITEMS) ×3 IMPLANT
PENCIL SMOKE EVAC W/HOLSTER (ELECTROSURGICAL) ×3 IMPLANT
RTRCTR C-SECT PINK 25CM LRG (MISCELLANEOUS) ×3 IMPLANT
STRIP CLOSURE SKIN 1/2X4 (GAUZE/BANDAGES/DRESSINGS) ×2 IMPLANT
SUT VIC AB 0 CTX 36 (SUTURE) ×6
SUT VIC AB 0 CTX36XBRD ANBCTRL (SUTURE) ×3 IMPLANT
SUT VIC AB 2-0 CT1 27 (SUTURE) ×2
SUT VIC AB 2-0 CT1 TAPERPNT 27 (SUTURE) ×1 IMPLANT
SUT VIC AB 4-0 KS 27 (SUTURE) ×3 IMPLANT
TOWEL OR 17X24 6PK STRL BLUE (TOWEL DISPOSABLE) ×3 IMPLANT
TRAY FOLEY BAG SILVER LF 14FR (SET/KITS/TRAYS/PACK) ×3 IMPLANT

## 2017-12-14 NOTE — Progress Notes (Signed)
Found pt food tray empty, pt did not call out for cbg check before eating. Educated pt  importance of checking her cbg and meal coverage. Pt verbalize understanding.

## 2017-12-14 NOTE — Progress Notes (Signed)
Patient does not wish to use Hospital Interpreters.[place] Interpreter.

## 2017-12-14 NOTE — Progress Notes (Signed)
Patient requested not to use the in house spanish interpreter during her stay. Patient made aware of the availability of the other interpreter services and of her right to request them at any time. Pt declined the need at this time.

## 2017-12-14 NOTE — Anesthesia Procedure Notes (Signed)
Spinal  Patient location during procedure: OR Staffing Anesthesiologist: Nolon Nations, MD Performed: anesthesiologist  Preanesthetic Checklist Completed: patient identified, site marked, surgical consent, pre-op evaluation, timeout performed, IV checked, risks and benefits discussed and monitors and equipment checked Spinal Block Patient position: sitting Prep: site prepped and draped and DuraPrep Patient monitoring: heart rate, continuous pulse ox and blood pressure Approach: midline Location: L3-4 Injection technique: single-shot Needle Needle type: Sprotte  Needle gauge: 24 G Needle length: 9 cm Assessment Sensory level: T6 Additional Notes Expiration date of kit checked and confirmed. Patient tolerated procedure well, without complications.

## 2017-12-14 NOTE — Lactation Note (Signed)
This note was copied from a baby's chart. Lactation Consultation Note: assisted mom with first pumping of 34.6 week NICU baby. Mom is experienced BF mom. Reviewed setup,use and cleaning of pump pieces. Mom pumped for 15 min. Obtained a few drops of Colostrum with hand expression. NICU booklet given and reviewed with parents. No questions at present. Reviewed importance of frequent pumping to promote a good milk supply. Does not have [place].  Patient Name: Julie [name] [id number] Date: 12/14/2017 Reason for consult: Initial assessment;NICU baby;Late-preterm 34-36.6wks   Maternal Data Formula Feeding for Exclusion: No Has patient been taught Hand Expression?: Yes Does the patient have breastfeeding experience prior to this delivery?: Yes  Feeding    LATCH Score                   Interventions Interventions: Hand express;DEBP  Lactation Tools Discussed/Used Tools: Pump Breast pump type: Double-Electric Breast Pump WIC Program: No   Consult Status Consult Status: Follow-up Date: 12/15/17 Follow-up type: In-patient    Truddie Crumble 12/14/2017, 10:34 AM

## 2017-12-14 NOTE — Anesthesia Postprocedure Evaluation (Signed)
Anesthesia Post Note  Patient: Counsellor  Procedure(s) Performed: CESAREAN SECTION (N/A )     Patient location during evaluation: [facility] Unit Anesthesia Type: Spinal Level of consciousness: awake Pain management: pain level controlled Vital Signs Assessment: post-procedure vital signs reviewed and stable Respiratory status: spontaneous breathing Cardiovascular status: stable Postop Assessment: no headache, no backache, spinal receding, patient able to bend at knees, no apparent nausea or vomiting and adequate PO intake Anesthetic complications: no    Last Vitals:  Vitals:   12/14/17 0953 12/14/17 1700  BP: 130/88 115/74  Pulse: 86 78  Resp:  18  Temp:  36.7 C  SpO2:  98%    Last Pain:  Vitals:   12/14/17 1818  TempSrc:   PainSc: Asleep   Pain Goal: Patients Stated Pain Goal: 2 (12/14/17 0950)               [place] [employer]
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
## 2017-12-14 NOTE — Op Note (Signed)
Julie [name] PROCEDURE DATE: 12/14/2017  PREOPERATIVE DIAGNOSIS: Intrauterine pregnancy at  [redacted]w[redacted]d weeks gestation; non-reassuring fetal status  POSTOPERATIVE DIAGNOSIS: The same  PROCEDURE: Repeat Low Transverse Cesarean Section  SURGEON:  Dr. Loma Boston  ASSISTANT: none  INDICATIONS: Julie [name] is a 36 y.o. [id number] at [redacted]w[redacted]d scheduled for cesarean section secondary to non-reassuring fetal status.  The risks of cesarean section discussed with the patient included but were not limited to: bleeding which may require transfusion or reoperation; infection which may require antibiotics; injury to bowel, bladder, ureters or other surrounding organs; injury to the fetus; need for additional procedures including hysterectomy in the event of a life-threatening hemorrhage; placental abnormalities wth subsequent pregnancies, incisional problems, thromboembolic phenomenon and other postoperative/anesthesia complications. The patient concurred with the proposed plan, giving informed written consent for the procedure.    FINDINGS:  Viable female infant in vertex presentation.  Apgars 3 and 7, weight, 8 pounds and 14.5 ounces.  Clear amniotic fluid.  Intact placenta, three vessel cord.  Normal uterus, fallopian tubes and ovaries bilaterally. Cord arterial gas: 6.869. PCO2: 113. Corrected pH: 7.41  ANESTHESIA:    Spinal INTRAVENOUS FLUIDS:1700 ml ESTIMATED BLOOD LOSS: 530 ml URINE OUTPUT:  400 ml SPECIMENS: Placenta sent to pathology COMPLICATIONS: None immediate  PROCEDURE IN DETAIL:  The patient received intravenous antibiotics and had sequential compression devices applied to her lower extremities while in the preoperative area.  She was then taken to the operating room where spinal anesthesia was administered and was found to be adequate. She was then placed in a dorsal supine position with a leftward tilt, and prepped and draped in a sterile manner.  A foley catheter was placed into  her bladder and attached to constant gravity, which drained clear fluid throughout.  After an adequate timeout was performed, a Pfannenstiel skin incision was made with scalpel and carried through to the underlying layer of fascia. The fascia was incised in the midline and this incision was extended bilaterally using the Mayo scissors. Kocher clamps were applied to the superior aspect of the fascial incision and the underlying rectus muscles were dissected off bluntly. A similar process was carried out on the inferior aspect of the facial incision. The rectus muscles were separated in the midline bluntly and the peritoneum was entered bluntly. An Alexis retractor was placed to aid in visualization of the uterus.  Attention was turned to the lower uterine segment where a transverse hysterotomy was made with a scalpel and extended bilaterally bluntly. The infant was successfully delivered, and cord was clamped and cut and infant was handed over to awaiting neonatology team. Uterine massage was then administered and the placenta delivered intact with three-vessel cord. The uterus was then cleared of clot and debris.  The hysterotomy was closed with 0 Vicryl in a running locked fashionl. Overall, excellent hemostasis was noted. The abdomen and the pelvis were cleared of all clot and debris and the Ubaldo Glassing was removed. Hemostasis was confirmed on all surfaces.  The peritoneum was reapproximated using 2-0 vicryl running stitches. The fascia was then closed using 0 Vicryl in a running fashion. The skin was closed with 4-0 vicryl. The patient tolerated the procedure well. Sponge, lap, instrument and needle counts were correct x 2. She was taken to the recovery room in stable condition.    Truett Mainland, DO 12/14/2017 2:38 AM
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
## 2017-12-14 NOTE — Transfer of Care (Signed)
Immediate Anesthesia Transfer of Care Note  Patient: [facility]  Procedure(s) Performed: CESAREAN SECTION (N/A )  Patient Location: PACU  Anesthesia Type:Spinal  Level of Consciousness: awake, alert  and oriented  Airway & Oxygen Therapy: Patient Spontanous Breathing  Post-op Assessment: Report given to RN and Post -op Vital signs reviewed and stable  Post vital signs: Reviewed and stable HR 80, RR 16, SaO2 99%, BP 130/82  Last Vitals:  Vitals:   12/13/17 2351  BP: 128/78  Pulse: 80  Resp: 20  Temp: 37.1 C    Last Pain:  Vitals:   12/14/17 0140  TempSrc:   PainSc: 0-No pain         Complications: No apparent anesthesia complications

## 2017-12-14 NOTE — Anesthesia Preprocedure Evaluation (Signed)
Anesthesia Evaluation  Patient identified by MRN, date of birth, ID band Patient awake    Reviewed: Allergy & Precautions, NPO status , Patient's Chart, lab work & pertinent test results  Airway Mallampati: II  TM Distance: >3 FB Neck ROM: Full    Dental no notable dental hx.    Pulmonary neg pulmonary ROS,    Pulmonary exam normal breath sounds clear to auscultation       Cardiovascular negative cardio ROS Normal cardiovascular exam Rhythm:Regular Rate:Normal     Neuro/Psych negative neurological ROS  negative psych ROS   GI/Hepatic negative GI ROS, Neg liver ROS,   Endo/Other  negative endocrine ROSdiabetes  Renal/GU negative Renal ROS     Musculoskeletal  (+) Arthritis ,   Abdominal   Peds  Hematology negative hematology ROS (+)   Anesthesia Other Findings   Reproductive/Obstetrics (+) Pregnancy                             Anesthesia Physical Anesthesia Plan  ASA: II and emergent  Anesthesia Plan: Spinal   Post-op Pain Management:    Induction:   PONV Risk Score and Plan: 4 or greater and Ondansetron, Scopolamine patch - Pre-op and Treatment may vary due to age or medical condition  Airway Management Planned:   Additional Equipment:   Intra-op Plan:   Post-operative Plan:   Informed Consent: I have reviewed the patients History and Physical, chart, labs and discussed the procedure including the risks, benefits and alternatives for the proposed anesthesia with the patient or authorized representative who has indicated his/her understanding and acceptance.   Dental advisory given  Plan Discussed with: CRNA  Anesthesia Plan Comments:         Anesthesia Quick Evaluation

## 2017-12-15 LAB — GLUCOSE, CAPILLARY
GLUCOSE-CAPILLARY: 85 mg/dL (ref 65–99)
Glucose-Capillary: 106 mg/dL — ABNORMAL HIGH (ref 65–99)
Glucose-Capillary: 99 mg/dL (ref 65–99)
Glucose-Capillary: 264 mg/dL — ABNORMAL HIGH (ref 65–99)

## 2017-12-15 MED ORDER — SENNOSIDES-DOCUSATE SODIUM 8.6-50 MG PO TABS
2.0000 | ORAL_TABLET | Freq: Every evening | ORAL | Status: DC | PRN
  Administered 2017-12-15: 2 via ORAL
  Filled 2017-12-15: qty 2

## 2017-12-15 MED ORDER — POLYETHYLENE GLYCOL 3350 17 G PO PACK
17.0000 g | PACK | Freq: Every day | ORAL | Status: DC
  Administered 2017-12-15 – 2017-12-16 (×2): 17 g via ORAL
  Filled 2017-12-15 (×2): qty 1

## 2017-12-15 MED ORDER — FERROUS SULFATE 325 (65 FE) MG PO TABS
325.0000 mg | ORAL_TABLET | Freq: Every day | ORAL | Status: DC
  Administered 2017-12-16: 325 mg via ORAL
  Filled 2017-12-15: qty 1

## 2017-12-15 NOTE — Progress Notes (Signed)
Subjective: Postpartum Day 1: Cesarean Delivery Patient reports incisional pain and tolerating PO.    Objective: Vital signs in last 24 hours: Temp:  [97.9 F (36.6 C)-98.2 F (36.8 C)] 97.9 F (36.6 C) (12/29 0617) Pulse Rate:  [76-86] 78 (12/29 0617) Resp:  [16-20] 18 (12/29 0617) BP: (95-130)/(51-88) 95/55 (12/29 0617) SpO2:  [96 %-100 %] 100 % (12/29 0617) Weight:  [94.8 kg (209 lb 0.1 oz)] 94.8 kg (209 lb 0.1 oz) (12/29 0617)  Physical Exam:  General: alert, cooperative and no distress Lochia: appropriate Uterine Fundus: firm Incision: healing well, dark blood stained honeycomb dressing DVT Evaluation: No evidence of DVT seen on physical exam.  CBG (last 3)  Recent Labs    12/14/17 0843 12/14/17 1322 12/14/17 2202  GLUCAP 175* 174* 209*     Assessment/Plan: Status post Cesarean section. Doing well postoperatively.  Continue current care. Start glyburide for BG control  Emeterio Reeve 12/15/2017, 6:49 AM

## 2017-12-16 LAB — GLUCOSE, CAPILLARY: Glucose-Capillary: 103 mg/dL — ABNORMAL HIGH (ref 65–99)

## 2017-12-16 MED ORDER — IBUPROFEN 600 MG PO TABS: 600.0000 mg | ORAL_TABLET | Freq: Four times a day (QID) | ORAL | 0 refills | Status: DC | PRN

## 2017-12-16 MED ORDER — SIMETHICONE 80 MG PO CHEW
80.0000 mg | CHEWABLE_TABLET | Freq: Three times a day (TID) | ORAL | 1 refills | Status: AC
Start: 2017-12-16 — End: ?

## 2017-12-16 MED ORDER — POLYETHYLENE GLYCOL 3350 17 G PO PACK: 17.0000 g | PACK | Freq: Every day | ORAL | 0 refills | Status: DC

## 2017-12-16 MED ORDER — OXYCODONE-ACETAMINOPHEN 5-325 MG PO TABS: 1.0000 | ORAL_TABLET | Freq: Four times a day (QID) | ORAL | 0 refills | Status: DC | PRN

## 2017-12-16 MED ORDER — GLYBURIDE 5 MG PO TABS: 5.0000 mg | ORAL_TABLET | Freq: Every day | ORAL | 2 refills | Status: DC

## 2017-12-16 MED ORDER — METFORMIN HCL 500 MG PO TABS: 1000.0000 mg | ORAL_TABLET | Freq: Two times a day (BID) | ORAL | Status: DC

## 2017-12-16 NOTE — Progress Notes (Signed)
Discharged home, ambulatory, in stable condition. 

## 2017-12-16 NOTE — Progress Notes (Signed)
Discharge instructions given to patient along with Rx for Percocet. Patient verbalized understanding of all instructions given. Written copy of AVS given to patient.

## 2017-12-16 NOTE — Discharge Summary (Signed)
Obstetrical Discharge Summary  Date of Admission: 12/13/2017 Date of Discharge: 12/16/2017  [facility]: [facility]  Gestational Age at Delivery: [redacted]w[redacted]d   Antepartum complications: DM2 (oral meds, poor control), AMA, history of cesarean x 2 Reason for Admission: non reassuring fetal status (BPP 2/8 with abnormal dopplers and ascites) Date of Delivery: 12/14/2017  Delivered By: Loma Boston, DO Delivery Type: Repeat c-section Intrapartum complications/course: None Anesthesia: spinal Placenta: Delivered and expressed via active management. Intact: yes. To pathology: yes.  Laceration: n/a Episiotomy: none EBL: 534mL Baby: Liveborn female, APGARs 3/7, weight 8lbs 14.5oz   Discharge Diagnosis: Delivered. Same  Postpartum course: Uncomplicated; she was meeting all post op goals, including flatus on the day of discharge. She was placed on glyburide 5mg  with breakfast and metformin 1000/1000 and achs BS checks with SSI. Her BS were improved prior to discharge. Importance of continuing to check BS values at home, especially to decrease risk of wound infection d/w pt. Pt desires d/c to home pod#2  Discharge Vital Signs:  Current Vital Signs 24h Vital Sign Ranges  T 98.4 F (36.9 C) Temp  Avg: 98.3 F (36.8 C)  Min: 98.1 F (36.7 C)  Max: 98.6 F (37 C)  BP (!) 106/52 BP  Min: 105/53  Max: 118/69  HR 67 Pulse  Avg: 77.2  Min: 67  Max: 82  RR 18 Resp  Avg: 18  Min: 18  Max: 18  SaO2 100 % Not Delivered SpO2  Avg: 99 %  Min: 97 %  Max: 100 %       24 Hour I/O Current Shift I/O  Time Ins Outs No intake/output data recorded. No intake/output data recorded.   Discharge Exam:  NAD Perineum: deferred Abdomen: firm fundus below the umbilicus, NTTP, non distended, +bowel sounds.  Incision c/d/i honeycomb dressing in place Normal s1 and s2, no MRGs CTAB Ext: no c/c/e  Recent Labs  Lab 12/14/17 0030 12/14/17 0704  WBC 7.5 11.9*  HGB 11.1* 10.6*  HCT 33.1* 32.4*   PLT 183 167   Results for Julie [name], Julie [name] (MRN [id number]) as of 12/16/2017 07:47  Ref. Range 12/14/2017 22:02 12/15/2017 07:33 12/15/2017 13:13 12/15/2017 20:27 12/15/2017 23:01  Glucose-Capillary Latest Ref Range: 65 - 99 mg/dL 209 (H) 106 (H) 99 85 264 (H)   Disposition: Home  Rh Immune globulin given: not applicable Rubella vaccine given: not applicable Tdap vaccine given in AP or PP setting: ordered prior to discharge  Contraception: IUD  Prenatal/Postnatal Panel: O POS//Rubella Immune//Varicella Unknown//RPR negative//HIV negative/HepB Surface Ag negative//pap no abnormalities, hpv neg (date: 2018)//plans to breastfeed  Plan:  Julie [name] was discharged to home in good condition. Follow-up appointment with [facility] in 7-10d. Request sent to clinic to cancel 1/3 visit and make one for later the next week for an incision and BS log check  Future Appointments  Date Time Provider [place]  12/20/2017  3:15 PM [facility] NST [facility] WOC  12/20/2017  4:15 PM Chancy Milroy, MD [facility] WOC    Discharge Medications: Allergies as of 12/16/2017      Reactions   Albuterol Hives   Pt reported hives      Medication List    TAKE these medications   acetaminophen 500 MG tablet Commonly known as:  TYLENOL Take 500 mg by mouth every 6 (six) hours as needed for moderate pain.   ferrous sulfate 325 (65 FE) MG tablet Commonly known as:  FERROUSUL Take 1 tablet (325 mg total) by mouth  2 (two) times daily.   glyBURIDE 5 MG tablet Commonly known as:  DIABETA Take 1 tablet (5 mg total) by mouth daily with breakfast. What changed:  when to take this   ibuprofen 600 MG tablet Commonly known as:  ADVIL,MOTRIN Take 1 tablet (600 mg total) by mouth every 6 (six) hours as needed for fever, headache, mild pain or cramping.   metFORMIN 500 MG tablet Commonly known as:  GLUCOPHAGE Take 2 tablets (1,000 mg total) by mouth 2 (two) times daily with a meal. TAKE 2 TABLETs  BY MOUTH TWICE DAILY WITH BREAKFAST AND SUPPER What changed:    how much to take  how to take this  when to take this   oxyCODONE-acetaminophen 5-325 MG tablet Commonly known as:  PERCOCET/ROXICET Take 1-2 tablets by mouth every 6 (six) hours as needed (pain scale 4-7).   polyethylene glycol packet Commonly known as:  MIRALAX / GLYCOLAX Take 17 g by mouth daily.   prenatal multivitamin Tabs tablet Take 1 tablet by mouth daily at 12 noon.   simethicone 80 MG chewable tablet Commonly known as:  MYLICON Chew 1 tablet (80 mg total) by mouth 3 (three) times daily after meals.       Durene Romans MD Attending [facility])

## 2017-12-16 NOTE — Discharge Instructions (Signed)
Continue to check your blood sugars like how you were doing during the pregnancy   Cesarean Delivery, Care After Refer to this sheet in the next few weeks. These instructions provide you with information on caring for yourself after your procedure. Your health care provider may also give you specific instructions. Your treatment has been planned according to current medical practices, but problems sometimes occur. Call your health care provider if you have any problems or questions after you go home. HOME CARE INSTRUCTIONS   Only take over-the-counter or prescription medications as directed by your health care provider.  Do not drink alcohol, especially if you are breastfeeding or taking medication to relieve pain.  Do not  smoke tobacco.  Continue to use good perineal care. Good perineal care includes:  Wiping your perineum from front to back.  Keeping your perineum clean.  Check your surgical cut (incision) daily for increased redness, drainage, swelling, or separation of skin.  Shower and clean your incision gently with soap and water every day, by letting warm and soapy water run over the incision, and then pat it dry. If your health care provider says it is okay, leave the incision uncovered. Use a bandage (dressing) if the incision is draining fluid or appears irritated. If the adhesive strips across the incision do not fall off within 7 days, carefully peel them off, after a shower.  Hug a pillow when coughing or sneezing until your incision is healed. This helps to relieve pain.  Do not use tampons, douches or have sexual intercourse, until your health care provider says it is okay.  Wear a well-fitting bra that provides breast support.  Limit wearing support panties or control-top hose.  Drink enough fluids to keep your urine clear or pale yellow.  Eat high-fiber foods such as whole grain cereals and breads, brown rice, beans, and fresh fruits and vegetables every day.  These foods may help prevent or relieve constipation.  Resume activities such as climbing stairs, driving, lifting, exercising, or traveling as directed by your health care provider.  Try to have someone help you with your household activities and your newborn for at least a few days after you leave the hospital.  Rest as much as possible. Try to rest or take a nap when your newborn is sleeping.  Increase your activities gradually.  Do not lift more than 15lbs until directed by a provider.  Keep all of your scheduled postpartum appointments. It is very important to keep your scheduled follow-up appointments. At these appointments, your health care provider will be checking to make sure that you are healing physically and emotionally. SEEK MEDICAL CARE IF:   You are passing large clots from your vagina. Save any clots to show your health care provider.  You have a foul smelling discharge from your vagina.  You have trouble urinating.  You are urinating frequently.  You have pain when you urinate.  You have a change in your bowel movements.  You have increasing redness, pain, or swelling near your incision.  You have pus draining from your incision.  Your incision is separating.  You have painful, hard, or reddened breasts.  You have a severe headache.  You have blurred vision or see spots.  You feel sad or depressed.  You have thoughts of hurting yourself or your newborn.  You have questions about your care, the care of your newborn, or medications.  You are dizzy or light-headed.  You have a rash.  You have pain,  redness, or swelling at the site of the removed intravenous access (IV) tube.  You have nausea or vomiting.  You stopped breastfeeding and have not had a menstrual period within 12 weeks of stopping.  You are not breastfeeding and have not had a menstrual period within 12 weeks of delivery.  You have a fever. SEEK IMMEDIATE MEDICAL CARE IF:  You  have persistent pain.  You have chest pain.  You have shortness of breath.  You faint.  You have leg pain.  You have stomach pain.  Your vaginal bleeding saturates 2 or more sanitary pads in 1 hour. MAKE SURE YOU:   Understand these instructions.  Will watch your condition.  Will get help right away if you are not doing well or get worse. Document Released: 08/26/2002 Document Revised: 04/20/2014 Document Reviewed: 07/31/2012 [facility] Patient Information 2015 [place], [place]. This information is not intended to replace advice given to you by your health care provider. Make sure you discuss any questions you have with your health care provider.

## 2017-12-16 NOTE — Lactation Note (Signed)
This note was copied from a baby's chart. Lactation Consultation Note  Patient Name: Julie [name] Today's Date: 12/16/2017   [facility] loaner given with instructions on use and cleaning of pump parts.  Mom's milk coming in, pumping 2-3 oz now.   Reviewed basics of pumping and collecting her milk to transport to NICU.    Mom aware of lactation support available to her, encouraged to call for help prn.   Broadus John 12/16/2017, 12:24 PM

## 2017-12-17 LAB — GC/CHLAMYDIA PROBE AMP (~~LOC~~) NOT AT ARMC
CHLAMYDIA, DNA PROBE: NEGATIVE
NEISSERIA GONORRHEA: NEGATIVE

## 2017-12-20 ENCOUNTER — Other Ambulatory Visit: Payer: Self-pay

## 2017-12-20 ENCOUNTER — Encounter: Payer: Self-pay | Admitting: Obstetrics and Gynecology

## 2017-12-24 ENCOUNTER — Ambulatory Visit: Payer: Self-pay | Admitting: General Practice

## 2017-12-24 ENCOUNTER — Other Ambulatory Visit (HOSPITAL_COMMUNITY)
Admission: RE | Admit: 2017-12-24 | Discharge: 2017-12-24 | Disposition: A | Discharge status: Home / Self Care | Payer: Self-pay | Source: Ambulatory Visit | Attending: Family Medicine | Admitting: Family Medicine

## 2017-12-24 VITALS — BP 124/76 | HR 80 | Ht 65.0 in | Wt 209.0 lb

## 2017-12-24 DIAGNOSIS — N898 Other specified noninflammatory disorders of vagina: Secondary | ICD-10-CM | POA: Insufficient documentation

## 2017-12-24 DIAGNOSIS — Z5189 Encounter for other specified aftercare: Secondary | ICD-10-CM

## 2017-12-24 DIAGNOSIS — Z0130 Encounter for examination of blood pressure without abnormal findings: Secondary | ICD-10-CM

## 2017-12-24 DIAGNOSIS — B9689 Other specified bacterial agents as the cause of diseases classified elsewhere: Secondary | ICD-10-CM | POA: Insufficient documentation

## 2017-12-24 NOTE — Progress Notes (Signed)
Patient here for wound check & BP check today. Patient denies headaches, dizziness or blurry vision. Patient reports vaginal odor & itching. Instructed patient in self swab & specimen collected. Told patient we will contact her with abnormal results. Removed honeycomb dressing. Incision clean, dry & intact. Reviewed wound care & signs and symptoms of infection. Patient verbalized understanding to all & had no questions.

## 2017-12-25 ENCOUNTER — Other Ambulatory Visit: Payer: Self-pay | Admitting: Family Medicine

## 2017-12-25 LAB — CERVICOVAGINAL ANCILLARY ONLY
BACTERIAL VAGINITIS: POSITIVE — AB
Candida vaginitis: NEGATIVE
Trichomonas: NEGATIVE

## 2017-12-25 MED ORDER — METRONIDAZOLE 500 MG PO TABS
500.0000 mg | ORAL_TABLET | Freq: Two times a day (BID) | ORAL | 0 refills | Status: DC
Start: 2017-12-25 — End: 2018-01-21

## 2017-12-26 ENCOUNTER — Other Ambulatory Visit: Payer: Self-pay

## 2017-12-26 NOTE — Progress Notes (Signed)
Patient presented to the office for self swab results. Pt informed of positive BV results. Medication was sent to pharmacy.

## 2017-12-27 ENCOUNTER — Ambulatory Visit: Payer: Self-pay

## 2018-01-21 ENCOUNTER — Encounter: Payer: Self-pay | Admitting: Family Medicine

## 2018-01-21 ENCOUNTER — Ambulatory Visit (INDEPENDENT_AMBULATORY_CARE_PROVIDER_SITE_OTHER): Payer: Medicaid Other | Admitting: Family Medicine

## 2018-01-21 DIAGNOSIS — Z30430 Encounter for insertion of intrauterine contraceptive device: Secondary | ICD-10-CM

## 2018-01-21 DIAGNOSIS — R21 Rash and other nonspecific skin eruption: Secondary | ICD-10-CM

## 2018-01-21 DIAGNOSIS — O24912 Unspecified diabetes mellitus in pregnancy, second trimester: Secondary | ICD-10-CM

## 2018-01-21 DIAGNOSIS — Z1389 Encounter for screening for other disorder: Secondary | ICD-10-CM

## 2018-01-21 LAB — POCT PREGNANCY, URINE: Preg Test, Ur: NEGATIVE

## 2018-01-21 MED ORDER — GLYBURIDE 5 MG PO TABS: 5.0000 mg | ORAL_TABLET | Freq: Every day | ORAL | 2 refills | Status: DC

## 2018-01-21 MED ORDER — METFORMIN HCL 500 MG PO TABS: 1000.0000 mg | ORAL_TABLET | Freq: Two times a day (BID) | ORAL | 0 refills | Status: DC

## 2018-01-21 NOTE — Progress Notes (Signed)
Subjective:     Julie [name] is a 37 y.o. female who presents for a postpartum visit. She is 5 weeks postpartum following a low cervical vertical Cesarean section. I have fully reviewed the prenatal and intrapartum course. The delivery was at 34 gestational weeks. Outcome: repeat cesarean section, low transverse incision. Anesthesia: spinal. Postpartum course has been normal. Baby's course has been complicated for prolonged NICU stay due to acidotic state. Baby is feeding by breast. Bleeding no bleeding. Bowel function is normal. Bladder function is normal. Patient is not sexually active. Contraception method is IUD. Postpartum depression screening: negative.  The following portions of the patient's history were reviewed and updated as appropriate: allergies, current medications, past family history, past medical history, past social history, past surgical history and problem list.  Review of Systems Pertinent items noted in HPI and remainder of comprehensive ROS otherwise negative.   Objective:    There were no vitals taken for this visit.  General:  alert, cooperative and no distress  Lungs: clear to auscultation bilaterally  Heart:  regular rate and rhythm, S1, S2 normal, no murmur, click, rub or gallop  Abdomen: soft, non-tender; bowel sounds normal; no masses,  no organomegaly, incision clean, dry, intact.   Vulva:  normal  Vagina: normal vagina, no discharge, exudate, lesion, or erythema  Cervix:  multiparous appearance and no lesions         IUD Procedure Note Patient identified, informed consent performed, signed copy in chart, time out was performed.  Urine pregnancy test negative.  Speculum placed in the vagina.  Cervix visualized.  Cleaned with Betadine x 2.  Grasped anteriorly with a single tooth tenaculum.  Uterus sounded to 8 cm.  Liletta  IUD placed per manufacturer's recommendations.  Strings trimmed to 3 cm. Tenaculum was removed, good hemostasis noted.  Patient  tolerated procedure well.   Patient given post procedure instructions and Liletta care card with expiration date.  Patient is asked to check IUD strings periodically and follow up in 4-6 weeks for IUD check.   Assessment:     normal postpartum exam. Pap smear not done at today's visit.   Plan:    1. Contraception: IUD 2. Continue metformin and glyburide, information for [facility] given 3. Follow up in: 4 weeks or as needed.

## 2018-01-22 ENCOUNTER — Encounter: Payer: Self-pay | Admitting: *Deleted

## 2018-01-22 ENCOUNTER — Other Ambulatory Visit: Payer: Self-pay | Admitting: Family Medicine

## 2018-01-22 DIAGNOSIS — O24912 Unspecified diabetes mellitus in pregnancy, second trimester: Secondary | ICD-10-CM

## 2018-03-07 ENCOUNTER — Ambulatory Visit: Payer: Medicaid Other | Admitting: Family Medicine

## 2018-03-27 ENCOUNTER — Ambulatory Visit: Payer: Medicaid Other

## 2019-08-14 ENCOUNTER — Other Ambulatory Visit: Payer: Self-pay

## 2019-08-14 DIAGNOSIS — Z20822 Contact with and (suspected) exposure to covid-19: Secondary | ICD-10-CM

## 2019-08-16 LAB — NOVEL CORONAVIRUS, NAA: SARS-CoV-2, NAA: NOT DETECTED

## 2019-11-19 ENCOUNTER — Other Ambulatory Visit: Payer: Self-pay | Admitting: Cardiology

## 2019-11-19 DIAGNOSIS — Z20822 Contact with and (suspected) exposure to covid-19: Secondary | ICD-10-CM

## 2019-11-23 ENCOUNTER — Encounter: Payer: Self-pay | Admitting: Family Medicine

## 2019-11-23 LAB — NOVEL CORONAVIRUS, NAA: SARS-CoV-2, NAA: NOT DETECTED

## 2019-11-25 NOTE — Progress Notes (Signed)
Patient notified of results via telephone.

## 2021-01-26 ENCOUNTER — Other Ambulatory Visit: Payer: Self-pay

## 2021-01-26 DIAGNOSIS — N644 Mastodynia: Secondary | ICD-10-CM

## 2021-02-10 ENCOUNTER — Other Ambulatory Visit: Payer: Self-pay

## 2021-02-10 ENCOUNTER — Ambulatory Visit
Admission: RE | Admit: 2021-02-10 | Discharge: 2021-02-10 | Disposition: A | Discharge status: Home / Self Care | Payer: No Typology Code available for payment source | Source: Ambulatory Visit | Attending: Obstetrics and Gynecology | Admitting: Obstetrics and Gynecology

## 2021-02-10 ENCOUNTER — Ambulatory Visit: Payer: Self-pay

## 2021-02-10 ENCOUNTER — Ambulatory Visit: Payer: Self-pay | Admitting: *Deleted

## 2021-02-10 VITALS — BP 122/78 | Wt 197.2 lb

## 2021-02-10 DIAGNOSIS — N644 Mastodynia: Secondary | ICD-10-CM

## 2021-02-10 DIAGNOSIS — Z1239 Encounter for other screening for malignant neoplasm of breast: Secondary | ICD-10-CM

## 2021-02-10 NOTE — Progress Notes (Signed)
[name] is a 40 y.o. female who presents to [facility] clinic today with complaint of left breast pain x one week that is constant. Patient states the pain feels like the sensation when the breasts are full of milk. Patient rated the pain at a 7 out of 10. Patient denied any lumps or discharge.     Pap Smear: Pap smear not completed today. Last Pap smear was 07/02/2017 at [facility] [place] [facility] and was normal with negative HPV. Per patient has no history of an abnormal Pap smear. Last Pap smear result is available in Epic.   Physical exam: Breasts Left breast slightly larger than right that per patient is a change. Patient complained of left breast swelling. No skin abnormalities bilateral breasts. No nipple retraction bilateral breasts. No nipple discharge bilateral breasts. No lymphadenopathy. No lumps palpated bilateral breasts. Complaints of left breast diffuse pain on exam.       Pelvic/Bimanual Pap is not indicated today per BCCCP guidelines.   Smoking History: Patient has never smoked.   Patient Navigation: Patient education provided. Access to services provided for patient through [place] program. Spanish interpreter Julie [name] from [facility] provided.    Breast and Cervical Cancer Risk Assessment: Patient does not have family history of breast cancer, known genetic mutations, or radiation treatment to the chest before age 13. Patient does not have history of cervical dysplasia, immunocompromised, or DES exposure in-utero.  Risk Assessment    Risk Scores      02/10/2021   Last edited by: Demetrius Revel, LPN   5-year risk: 0.3 %   Lifetime risk: 5.8 %         A: BCCCP exam without pap smear Complaint of left breast pain and swelling.  P: Referred patient to the [facility] for a diagnostic mammogram. Appointment scheduled Thursday,February 10, 2021 at 1010.  Loletta Parish, RN 02/10/2021 9:14 AM

## 2021-02-10 NOTE — Patient Instructions (Signed)
Explained breast self awareness with [facility]. Patient did not need a Pap smear today due to last Pap smear and HPV typing was 07/02/2017. Let her know BCCCP will cover Pap smears and HPV typing every 5 years unless has a history of abnormal Pap smears. Referred patient to the [facility] for a diagnostic mammogram. Appointment scheduled Thursday,February 10, 2021 at 1010. Patient aware of appointment and will be there. [facility] verbalized understanding.  Troyce Gieske, Arvil Chaco, RN 9:14 AM

## 2022-03-20 ENCOUNTER — Other Ambulatory Visit: Payer: Self-pay | Admitting: Student

## 2022-03-20 ENCOUNTER — Ambulatory Visit
Admission: RE | Admit: 2022-03-20 | Discharge: 2022-03-20 | Disposition: A | Discharge status: Home / Self Care | Payer: No Typology Code available for payment source | Attending: Nurse Practitioner | Admitting: Nurse Practitioner

## 2022-03-20 ENCOUNTER — Ambulatory Visit
Admission: RE | Admit: 2022-03-20 | Discharge: 2022-03-20 | Disposition: A | Discharge status: Home / Self Care | Payer: No Typology Code available for payment source | Source: Ambulatory Visit | Attending: Student | Admitting: Student

## 2022-03-20 DIAGNOSIS — M25462 Effusion, left knee: Secondary | ICD-10-CM | POA: Insufficient documentation

## 2022-03-28 ENCOUNTER — Other Ambulatory Visit: Payer: Self-pay | Admitting: Obstetrics & Gynecology

## 2022-03-28 DIAGNOSIS — Z1231 Encounter for screening mammogram for malignant neoplasm of breast: Secondary | ICD-10-CM

## 2022-05-11 ENCOUNTER — Ambulatory Visit
Admission: RE | Admit: 2022-05-11 | Discharge: 2022-05-11 | Disposition: A | Discharge status: Home / Self Care | Payer: No Typology Code available for payment source | Source: Ambulatory Visit | Attending: Obstetrics & Gynecology | Admitting: Obstetrics & Gynecology

## 2022-05-11 DIAGNOSIS — Z1231 Encounter for screening mammogram for malignant neoplasm of breast: Secondary | ICD-10-CM

## 2022-11-13 ENCOUNTER — Other Ambulatory Visit: Payer: Self-pay

## 2022-11-13 ENCOUNTER — Emergency Department (HOSPITAL_COMMUNITY): Payer: No Typology Code available for payment source

## 2022-11-13 ENCOUNTER — Encounter (HOSPITAL_COMMUNITY): Payer: Self-pay

## 2022-11-13 ENCOUNTER — Emergency Department (HOSPITAL_COMMUNITY)
Admission: EM | Admit: 2022-11-13 | Discharge: 2022-11-14 | Disposition: A | Discharge status: Home / Self Care | Payer: No Typology Code available for payment source | Attending: Emergency Medicine | Admitting: Emergency Medicine

## 2022-11-13 DIAGNOSIS — E119 Type 2 diabetes mellitus without complications: Secondary | ICD-10-CM | POA: Insufficient documentation

## 2022-11-13 DIAGNOSIS — R7989 Other specified abnormal findings of blood chemistry: Secondary | ICD-10-CM

## 2022-11-13 DIAGNOSIS — R7401 Elevation of levels of liver transaminase levels: Secondary | ICD-10-CM | POA: Insufficient documentation

## 2022-11-13 DIAGNOSIS — R1012 Left upper quadrant pain: Secondary | ICD-10-CM | POA: Insufficient documentation

## 2022-11-13 LAB — CBC
HCT: 40.2 % (ref 36.0–46.0)
Hemoglobin: 13.7 g/dL (ref 12.0–15.0)
MCH: 31.4 pg (ref 26.0–34.0)
MCHC: 34.1 g/dL (ref 30.0–36.0)
MCV: 92.2 fL (ref 80.0–100.0)
Platelets: 173 10*3/uL (ref 150–400)
RBC: 4.36 MIL/uL (ref 3.87–5.11)
RDW: 12.6 % (ref 11.5–15.5)
WBC: 4.6 10*3/uL (ref 4.0–10.5)
nRBC: 0 % (ref 0.0–0.2)

## 2022-11-13 LAB — URINALYSIS, ROUTINE W REFLEX MICROSCOPIC
Bacteria, UA: NONE SEEN
Bilirubin Urine: NEGATIVE
Glucose, UA: >=500 mg/dL — AB
Hgb urine dipstick: NEGATIVE
Ketones, ur: 5 mg/dL — AB
Leukocytes,Ua: NEGATIVE
Nitrite: NEGATIVE
Protein, ur: 30 mg/dL — AB
Specific Gravity, Urine: 1.035 — ABNORMAL HIGH (ref 1.005–1.030)
pH: 5 (ref 5.0–8.0)

## 2022-11-13 LAB — COMPREHENSIVE METABOLIC PANEL
ALT: 372 U/L — ABNORMAL HIGH (ref 0–44)
AST: 153 U/L — ABNORMAL HIGH (ref 15–41)
Albumin: 3.9 g/dL (ref 3.5–5.0)
Alkaline Phosphatase: 241 U/L — ABNORMAL HIGH (ref 38–126)
Anion gap: 10 (ref 5–15)
BUN: 11 mg/dL (ref 6–20)
CO2: 24 mmol/L (ref 22–32)
Calcium: 9 mg/dL (ref 8.9–10.3)
Chloride: 99 mmol/L (ref 98–111)
Creatinine, Ser: 0.46 mg/dL (ref 0.44–1.00)
GFR, Estimated: >60 mL/min (ref >60)
Glucose, Bld: 319 mg/dL — ABNORMAL HIGH (ref 70–99)
Potassium: 3.8 mmol/L (ref 3.5–5.1)
Sodium: 133 mmol/L — ABNORMAL LOW (ref 135–145)
Total Bilirubin: 1.3 mg/dL — ABNORMAL HIGH (ref 0.3–1.2)
Total Protein: 8 g/dL (ref 6.5–8.1)

## 2022-11-13 LAB — LIPASE, BLOOD: Lipase: 32 U/L (ref 11–51)

## 2022-11-13 NOTE — ED Triage Notes (Signed)
Patient got labs at her doctor done Friday and they called her that her liver enzymes were 25x more than normal. On Thursday had left upper abdominal pain.

## 2022-11-13 NOTE — ED Provider Triage Note (Signed)
Emergency Medicine Provider Triage Evaluation Note  [facility] , a 41 y.o. female  was evaluated in triage.  Pt complains of some left lower abdominal pain for the past couple weeks.  She states that she was seen at PCP office and was told today that she had abnormal liver enzymes.  She is not able to show me her labs however she states that her sclerae become more icteric since her symptoms began.  She states her abdominal pain is now much improved she states that it was predominant in the left lower abdomen.  She denies any vomiting or diarrhea.  No new medications.  Denies any alcohol or recreational drug use.  Review of Systems  Positive: Abdominal pain, abnormal labs Negative: Fever  Physical Exam  BP (!) 148/99 (BP Location: Right Arm)   Pulse 85   Temp 98.5 F (36.9 C) (Oral)   Resp 16   SpO2 98%  Gen:   Awake, no distress   Resp:  Normal effort  MSK:   Moves extremities without difficulty  Other:  Abd soft no significant focal abd ttp  Medical Decision Making  Medically screening exam initiated at 2:41 PM.  Appropriate orders placed.  [facility] was informed that the remainder of the evaluation will be completed by another provider, this initial triage assessment does not replace that evaluation, and the importance of remaining in the ED until their evaluation is complete.  Labs, RUQ ultrasound    Pati Gallo [place], [place] 11/13/22 1443
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
## 2022-11-14 NOTE — Discharge Instructions (Addendum)
You were evaluated in the Emergency Department and after careful evaluation, we did not find any emergent condition requiring admission or further testing in the hospital.  The blood testing of your liver continues to show some mild elevations.  However your ultrasound is normal.  Recommend follow-up with gastroenterology for future management/testing.  Recommend calling the number provided to set up an appointment.  Also recommend close follow-up with primary care doctor.  Please return to the Emergency Department if you experience any worsening of your condition.  Thank you for allowing [place] to be a part of your care.

## 2023-02-09 ENCOUNTER — Encounter: Payer: Self-pay | Admitting: Physician Assistant

## 2023-03-14 ENCOUNTER — Other Ambulatory Visit: Payer: Self-pay

## 2023-03-14 DIAGNOSIS — N644 Mastodynia: Secondary | ICD-10-CM
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
## 2023-03-28 ENCOUNTER — Other Ambulatory Visit (INDEPENDENT_AMBULATORY_CARE_PROVIDER_SITE_OTHER): Payer: Self-pay

## 2023-03-28 ENCOUNTER — Ambulatory Visit (INDEPENDENT_AMBULATORY_CARE_PROVIDER_SITE_OTHER): Payer: Self-pay | Admitting: Physician Assistant

## 2023-03-28 ENCOUNTER — Encounter: Payer: Self-pay | Admitting: Physician Assistant

## 2023-03-28 VITALS — BP 110/78 | HR 80 | Ht 65.0 in | Wt 183.0 lb

## 2023-03-28 DIAGNOSIS — R140 Abdominal distension (gaseous): Secondary | ICD-10-CM

## 2023-03-28 DIAGNOSIS — E119 Type 2 diabetes mellitus without complications: Secondary | ICD-10-CM

## 2023-03-28 DIAGNOSIS — R7989 Other specified abnormal findings of blood chemistry: Secondary | ICD-10-CM

## 2023-03-28 DIAGNOSIS — E0500 Thyrotoxicosis with diffuse goiter without thyrotoxic crisis or storm: Secondary | ICD-10-CM

## 2023-03-28 DIAGNOSIS — K5900 Constipation, unspecified: Secondary | ICD-10-CM

## 2023-03-28 LAB — SEDIMENTATION RATE: Sed Rate: 23 mm/hr — ABNORMAL HIGH (ref 0–20)

## 2023-03-28 LAB — CBC WITH DIFFERENTIAL/PLATELET
Basophils Absolute: 0.1 10*3/uL (ref 0.0–0.1)
Basophils Relative: 1 % (ref 0.0–3.0)
Eosinophils Absolute: 0.1 10*3/uL (ref 0.0–0.7)
Eosinophils Relative: 2.1 % (ref 0.0–5.0)
HCT: 45.7 % (ref 36.0–46.0)
Hemoglobin: 15.8 g/dL — ABNORMAL HIGH (ref 12.0–15.0)
Lymphocytes Relative: 29.7 % (ref 12.0–46.0)
Lymphs Abs: 1.9 10*3/uL (ref 0.7–4.0)
MCHC: 34.6 g/dL (ref 30.0–36.0)
MCV: 91.2 fl (ref 78.0–100.0)
Monocytes Absolute: 0.4 10*3/uL (ref 0.1–1.0)
Monocytes Relative: 6.4 % (ref 3.0–12.0)
Neutro Abs: 3.9 10*3/uL (ref 1.4–7.7)
Neutrophils Relative %: 60.8 % (ref 43.0–77.0)
Platelets: 198 10*3/uL (ref 150.0–400.0)
RBC: 5.02 Mil/uL (ref 3.87–5.11)
RDW: 12.8 % (ref 11.5–15.5)
WBC: 6.4 10*3/uL (ref 4.0–10.5)

## 2023-03-28 LAB — IBC + FERRITIN
Ferritin: 87.1 ng/mL (ref 10.0–291.0)
Iron: 113 ug/dL (ref 42–145)
Saturation Ratios: 29.2 % (ref 20.0–50.0)
TIBC: 386.4 ug/dL (ref 250.0–450.0)
Transferrin: 276 mg/dL (ref 212.0–360.0)

## 2023-03-28 LAB — HEPATIC FUNCTION PANEL
ALT: 19 U/L (ref 0–35)
AST: 18 U/L (ref 0–37)
Albumin: 4.5 g/dL (ref 3.5–5.2)
Alkaline Phosphatase: 77 U/L (ref 39–117)
Bilirubin, Direct: 0.1 mg/dL (ref 0.0–0.3)
Total Bilirubin: 0.8 mg/dL (ref 0.2–1.2)
Total Protein: 7.9 g/dL (ref 6.0–8.3)

## 2023-03-28 NOTE — Patient Instructions (Addendum)
Miralax is an osmotic laxative.  It only brings more water into the stool.  This is safe to take daily.  Can take up to 17 gram of miralax twice a day.  Mix with juice or coffee.  Start 1 capful at night for 3-4 days and reassess your response in 3-4 days.  You can increase and decrease the dose based on your response.  Remember, it can take up to 3-4 days to take effect OR for the effects to wear off.   I often pair this with benefiber in the morning to help assure the stool is not too loose.   Add fiber like benefiber or citracel once a day Increase activity Can do trial of IBGard which is over the counter for AB pain- Take 1-2 capsules once a day for maintence or twice a day during a flare  Please try low FODMAP diet- see below- start with eliminating just one column at a time, the table at the very bottom contains foods that are safe to take   FODMAP stands for fermentable oligo-, di-, mono-saccharides and polyols (1). These are the scientific terms used to classify groups of carbs that are notorious for triggering digestive symptoms like bloating, gas and stomach pain.     Your provider has requested that you go to the basement level for lab work before leaving today. Press "B" on the elevator. The lab is located at the first door on the left as you exit the elevator.  _______________________________________________________  If your blood pressure at your visit was 140/90 or greater, please contact your primary care physician to follow up on this.  _______________________________________________________  If you are age 41 or older, your body mass index should be between 23-30. Your There is no height or weight on file to calculate BMI. If this is out of the aforementioned range listed, please consider follow up with your Primary Care Provider.  If you are age 2 or younger, your body mass index should be between 19-25. Your There is no height or weight on file to calculate BMI. If  this is out of the aformentioned range listed, please consider follow up with your Primary Care Provider.   ________________________________________________________  The  GI providers would like to encourage you to use [facility] to communicate with providers for non-urgent requests or questions.  Due to long hold times on the telephone, sending your provider a message by [facility] may be a faster and more efficient way to get a response.  Please allow 48 business hours for a response.  Please remember that this is for non-urgent requests.  _______________________________________________________ It was a pleasure to see you today!  Thank you for trusting me with your gastrointestinal care!

## 2023-03-28 NOTE — Progress Notes (Signed)
Agree with assessment and plan as outlined.  

## 2023-04-02 LAB — IGG: IgG (Immunoglobin G), Serum: 1333 mg/dL (ref 600–1640)

## 2023-04-02 LAB — MITOCHONDRIAL ANTIBODIES: Mitochondrial M2 Ab, IgG: <=20 U (ref <=20.0)

## 2023-04-02 LAB — HEPATITIS C ANTIBODY: Hepatitis C Ab: NONREACTIVE

## 2023-04-02 LAB — TISSUE TRANSGLUTAMINASE, IGA: (tTG) Ab, IgA: <1 U/mL

## 2023-04-02 LAB — ANTI-SMOOTH MUSCLE ANTIBODY, IGG: Actin (Smooth Muscle) Antibody (IGG): 34 U — ABNORMAL HIGH (ref <20)

## 2023-04-02 LAB — HEPATITIS B SURFACE ANTIGEN: Hepatitis B Surface Ag: NONREACTIVE

## 2023-04-02 LAB — IGA: Immunoglobulin A: 374 mg/dL — ABNORMAL HIGH (ref 47–310)

## 2023-04-03 ENCOUNTER — Other Ambulatory Visit: Payer: Self-pay

## 2023-04-03 DIAGNOSIS — R7989 Other specified abnormal findings of blood chemistry: Secondary | ICD-10-CM

## 2023-05-07 ENCOUNTER — Other Ambulatory Visit: Payer: Self-pay

## 2023-05-17 ENCOUNTER — Ambulatory Visit: Payer: No Typology Code available for payment source

## 2023-05-17 ENCOUNTER — Other Ambulatory Visit: Payer: No Typology Code available for payment source

## 2023-05-31 ENCOUNTER — Ambulatory Visit: Payer: Self-pay | Admitting: Hematology and Oncology

## 2023-05-31 ENCOUNTER — Ambulatory Visit
Admission: RE | Admit: 2023-05-31 | Discharge: 2023-05-31 | Disposition: A | Discharge status: Home / Self Care | Payer: No Typology Code available for payment source | Source: Ambulatory Visit | Attending: Obstetrics and Gynecology | Admitting: Obstetrics and Gynecology

## 2023-05-31 ENCOUNTER — Ambulatory Visit: Payer: No Typology Code available for payment source

## 2023-05-31 VITALS — Wt 185.0 lb

## 2023-05-31 DIAGNOSIS — Z1231 Encounter for screening mammogram for malignant neoplasm of breast: Secondary | ICD-10-CM

## 2023-05-31 DIAGNOSIS — Z01419 Encounter for gynecological examination (general) (routine) without abnormal findings: Secondary | ICD-10-CM

## 2023-05-31 NOTE — Progress Notes (Signed)
[name] is a 42 y.o. [phone] female who presents to [facility] clinic today with no complaints.    Pap Smear: Pap smear completed today. Last Pap smear was 07/02/2017  and was normal. Per patient has no history of an abnormal Pap smear. Last Pap smear result is available in Epic.   Physical exam: Breasts Breasts symmetrical. No skin abnormalities bilateral breasts. No nipple retraction bilateral breasts. No nipple discharge bilateral breasts. No lymphadenopathy. No lumps palpated bilateral breasts. MS DIGITAL SCREENING TOMO BILATERAL  Result Date: 05/16/2022 CLINICAL DATA:  Screening. EXAM: DIGITAL SCREENING BILATERAL MAMMOGRAM WITH TOMOSYNTHESIS AND CAD TECHNIQUE: Bilateral screening digital craniocaudal and mediolateral oblique mammograms were obtained. Bilateral screening digital breast tomosynthesis was performed. The images were evaluated with computer-aided detection. COMPARISON:  Previous exam(s). ACR Breast Density Category d: The breast tissue is extremely dense, which lowers the sensitivity of mammography FINDINGS: There are no findings suspicious for malignancy. IMPRESSION: No mammographic evidence of malignancy. A result letter of this screening mammogram will be mailed directly to the patient. RECOMMENDATION: Screening mammogram in one year. (Code:SM-B-01Y) BI-RADS CATEGORY  1: Negative. Electronically Signed   By: Frederico Hamman M.D.   On: 05/16/2022 08:16   MS DIGITAL DIAG TOMO BILAT  Result Date: 02/10/2021 CLINICAL DATA:  42 year old female with generalized fullness/discomfort involving the left breast. EXAM: DIGITAL DIAGNOSTIC BILATERAL MAMMOGRAM WITH TOMOSYNTHESIS AND CAD TECHNIQUE: Bilateral digital diagnostic mammography and breast tomosynthesis was performed. The images were evaluated with computer-aided detection. COMPARISON:  None. ACR Breast Density Category d: The breast tissue is extremely dense, which lowers the sensitivity of mammography. FINDINGS: No  suspicious masses or calcifications are seen in either breast. There is no mammographic evidence of malignancy in either breast. IMPRESSION: No mammographic evidence of malignancy in either breast. RECOMMENDATION: 1. Recommend further management of left breast symptoms be based on clinical assessment. 2.  Screening mammogram in one year.(Code:SM-B-01Y) I have discussed the findings and recommendations with the patient. If applicable, a reminder letter will be sent to the patient regarding the next appointment. BI-RADS CATEGORY  1: Negative. Electronically Signed   By: Edwin Cap M.D.   On: 02/10/2021 10:46          Pelvic/Bimanual Ext Genitalia No lesions, no swelling and no discharge observed on external genitalia.        Vagina Vagina pink and normal texture. No lesions or discharge observed in vagina.        Cervix Cervix is present. Cervix pink and of normal texture. No discharge observed.    Uterus Uterus is present and palpable. Uterus in normal position and normal size.        Adnexae Bilateral ovaries present and palpable. No tenderness on palpation.         Rectovaginal No rectal exam completed today since patient had no rectal complaints. No skin abnormalities observed on exam.     Smoking History: Patient has never smoked and was not referred to quit line.    Patient Navigation: Patient education provided. Access to services provided for patient through [facility] program. No interpreter provided. No transportation provided   Colorectal Cancer Screening: Per patient has never had colonoscopy completed No complaints today.    Breast and Cervical Cancer Risk Assessment: Patient does not have family history of breast cancer, known genetic mutations, or radiation treatment to the chest before age 50. Patient does not have history of cervical dysplasia, immunocompromised, or DES exposure in-utero.  Risk Scores as of 05/31/2023  [place]           5-year 0.58 %   Lifetime  8.04 %   This patient is [place] but has no documented birth country, so the [place] model used data from [facility] patients to calculate their risk score. Document a birth country in the Demographics activity for a more accurate score.         Last calculated by Caprice Red, CMA on 05/31/2023 at  8:27 AM        A: [facility] exam with pap smear Complaint of right breast pain that comes and goes. It has since resolved.   P: Referred patient to the [facility] for a screening mammogram. Appointment scheduled 05/31/23.  Ilda Basset A, NP 05/31/2023 8:29 AM

## 2023-05-31 NOTE — Patient Instructions (Addendum)
Taught [facility] about self breast awareness and gave educational materials to take home. Patient did not need a Pap smear today due to last Pap smear was in 2018  per patient.  Let her know [facility] will cover Pap smears every 5 years unless has a history of abnormal Pap smears. Referred patient to the [facility] for screening mammogram. Appointment scheduled for 05/31/23. Patient aware of appointment and will be there. Let patient know will follow up with her within the next couple weeks with results. [facility] verbalized understanding.  Pascal Lux, NP 8:19 AM

## 2023-06-01 LAB — CYTOLOGY - PAP
Adequacy: ABSENT
Comment: NEGATIVE
Diagnosis: NEGATIVE
High risk HPV: NEGATIVE

## 2024-07-10 ENCOUNTER — Other Ambulatory Visit: Payer: Self-pay | Admitting: Obstetrics & Gynecology

## 2024-07-10 DIAGNOSIS — Z1231 Encounter for screening mammogram for malignant neoplasm of breast: Secondary | ICD-10-CM

## 2024-07-31 ENCOUNTER — Ambulatory Visit
Admission: RE | Admit: 2024-07-31 | Discharge: 2024-07-31 | Disposition: A | Discharge status: Home / Self Care | Source: Ambulatory Visit | Attending: Obstetrics & Gynecology | Admitting: Obstetrics & Gynecology

## 2024-07-31 DIAGNOSIS — Z1231 Encounter for screening mammogram for malignant neoplasm of breast: Secondary | ICD-10-CM

## 2024-08-05 ENCOUNTER — Ambulatory Visit: Payer: Self-pay | Admitting: Obstetrics & Gynecology

## 2024-09-12 ENCOUNTER — Encounter: Payer: Self-pay | Admitting: Nurse Practitioner

## 2025-01-19 ENCOUNTER — Ambulatory Visit: Payer: Self-pay | Admitting: Obstetrics and Gynecology

## 2025-01-27 ENCOUNTER — Ambulatory Visit: Admitting: Family Medicine
